# Patient Record
Sex: Female | Born: 1990 | Race: Black or African American | Hispanic: No | Marital: Single | Smoking: Current every day smoker
Health system: Southern US, Community
[De-identification: ages and names within clinical notes are randomized; demographics above are authoritative.]

## PROBLEM LIST (undated history)

## (undated) ENCOUNTER — Emergency Department (HOSPITAL_COMMUNITY): Admission: EM | Payer: Federal, State, Local not specified - PPO | Source: Home / Self Care

## (undated) DIAGNOSIS — G43909 Migraine, unspecified, not intractable, without status migrainosus: Secondary | ICD-10-CM

## (undated) DIAGNOSIS — Z789 Other specified health status: Secondary | ICD-10-CM

## (undated) HISTORY — PX: NO PAST SURGERIES: SHX2092

---
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
## 2008-08-13 ENCOUNTER — Emergency Department (HOSPITAL_COMMUNITY): Admission: EM | Admit: 2008-08-13 | Discharge: 2008-08-13 | Payer: Self-pay | Admitting: Family Medicine

## 2009-03-09 ENCOUNTER — Inpatient Hospital Stay (HOSPITAL_COMMUNITY): Admission: AD | Admit: 2009-03-09 | Discharge: 2009-03-10 | Payer: Self-pay | Admitting: Obstetrics and Gynecology

## 2009-03-23 ENCOUNTER — Inpatient Hospital Stay (HOSPITAL_COMMUNITY): Admission: AD | Admit: 2009-03-23 | Discharge: 2009-03-23 | Payer: Self-pay | Admitting: Obstetrics & Gynecology

## 2009-03-23 ENCOUNTER — Ambulatory Visit: Payer: Self-pay | Admitting: Obstetrics and Gynecology

## 2009-03-26 ENCOUNTER — Inpatient Hospital Stay (HOSPITAL_COMMUNITY): Admission: AD | Admit: 2009-03-26 | Discharge: 2009-03-26 | Payer: Self-pay | Admitting: Obstetrics & Gynecology

## 2009-03-26 ENCOUNTER — Ambulatory Visit: Payer: Self-pay | Admitting: Obstetrics & Gynecology

## 2009-03-26 ENCOUNTER — Inpatient Hospital Stay (HOSPITAL_COMMUNITY): Admission: AD | Admit: 2009-03-26 | Discharge: 2009-03-29 | Payer: Self-pay | Admitting: Obstetrics & Gynecology

## 2010-12-05 LAB — CBC
HCT: 37.8 % (ref 36.0–46.0)
Hemoglobin: 12.8 g/dL (ref 12.0–15.0)
MCHC: 33.9 g/dL (ref 30.0–36.0)
MCV: 89.2 fL (ref 78.0–100.0)
Platelets: 247 10*3/uL (ref 150–400)
RBC: 4.24 MIL/uL (ref 3.87–5.11)
RDW: 13.6 % (ref 11.5–15.5)
WBC: 13.5 10*3/uL — ABNORMAL HIGH (ref 4.0–10.5)

## 2010-12-05 LAB — GC/CHLAMYDIA PROBE AMP, URINE
Chlamydia, Swab/Urine, PCR: NEGATIVE
GC Probe Amp, Urine: NEGATIVE

## 2010-12-05 LAB — RAPID URINE DRUG SCREEN, HOSP PERFORMED
Amphetamines: NOT DETECTED
Barbiturates: NOT DETECTED
Benzodiazepines: NOT DETECTED
Cocaine: NOT DETECTED
Opiates: NOT DETECTED
Tetrahydrocannabinol: NOT DETECTED

## 2010-12-05 LAB — RPR: RPR Ser Ql: NONREACTIVE

## 2011-06-03 LAB — GC/CHLAMYDIA PROBE AMP, GENITAL
Chlamydia, DNA Probe: POSITIVE — AB
GC Probe Amp, Genital: NEGATIVE

## 2011-06-03 LAB — POCT URINALYSIS DIP (DEVICE)
Glucose, UA: NEGATIVE mg/dL
Hgb urine dipstick: NEGATIVE
Ketones, ur: NEGATIVE mg/dL
Nitrite: NEGATIVE
Protein, ur: 30 mg/dL — AB
Specific Gravity, Urine: 1.02 (ref 1.005–1.030)
Urobilinogen, UA: 1 mg/dL (ref 0.0–1.0)
pH: 8.5 — ABNORMAL HIGH (ref 5.0–8.0)

## 2011-06-03 LAB — WET PREP, GENITAL: Trich, Wet Prep: NONE SEEN

## 2011-06-03 LAB — POCT PREGNANCY, URINE: Preg Test, Ur: POSITIVE

## 2012-02-10 ENCOUNTER — Encounter (HOSPITAL_COMMUNITY): Payer: Self-pay | Admitting: Physical Medicine and Rehabilitation

## 2012-02-10 ENCOUNTER — Emergency Department (HOSPITAL_COMMUNITY)
Admission: EM | Admit: 2012-02-10 | Discharge: 2012-02-10 | Disposition: A | Discharge status: Home / Self Care | Payer: Federal, State, Local not specified - PPO | Attending: Emergency Medicine | Admitting: Emergency Medicine

## 2012-02-10 DIAGNOSIS — L509 Urticaria, unspecified: Secondary | ICD-10-CM | POA: Insufficient documentation

## 2012-02-10 MED ORDER — PREDNISONE 10 MG PO TABS: 20.0000 mg | ORAL_TABLET | Freq: Every day | ORAL | Status: DC

## 2012-02-10 MED ORDER — DIPHENHYDRAMINE HCL 25 MG PO CAPS
50.0000 mg | ORAL_CAPSULE | Freq: Once | ORAL | Status: AC
  Administered 2012-02-10: 50 mg via ORAL
  Filled 2012-02-10: qty 2

## 2012-02-10 MED ORDER — PREDNISONE 20 MG PO TABS
60.0000 mg | ORAL_TABLET | Freq: Once | ORAL | Status: AC
  Administered 2012-02-10: 60 mg via ORAL
  Filled 2012-02-10: qty 3

## 2012-02-10 NOTE — Discharge Instructions (Signed)
Hives Hives (urticaria) are itchy, red, swollen patches on the skin. They may change size, shape, and location quickly and repeatedly. Hives that occur deeper in the skin can cause swelling of the hands, feet, and face. Hives may be an allergic reaction to something you or your child ate, touched, or put on the skin. Hives can also be a reaction to cold, heat, viral infections, medication, insect bites, or emotional stress. Often the cause is hard to find. Hives can come and go for several days to several weeks. Hives are not contagious. HOME CARE INSTRUCTIONS   If the cause of the hives is known, avoid exposure to that source.   To relieve itching and rash:   Apply cold compresses to the skin or take cool water baths. Do not take or give your child hot baths or showers because the warmth will make the itching worse.   The best medicine for hives is an antihistamine. An antihistamine will not cure hives, but it will reduce their severity. You can use an antihistamine available over the counter. This medicine may make your child sleepy. Teenagers should not drive while using this medicine.   Take or give an antihistamine every 6 hours until the hives are completely gone for 24 hours or as directed.   Your child may have other medications prescribed for itching. Give these as directed by your child's caregiver.   You or your child should wear loose fitting clothing, including undergarments. Skin irritations may make hives worse.   Follow-up as directed by your caregiver.  SEEK MEDICAL CARE IF:   You or your child still have considerable itching after taking the medication (prescribed or purchased over the counter).   Joint swelling or pain occurs.  SEEK IMMEDIATE MEDICAL CARE IF:   You have a fever.   Swollen lips or tongue are noticed.   There is difficulty with breathing, swallowing, or tightness in the throat or chest.   Abdominal pain develops.   Your child starts acting very  sick.  These may be the first signs of a life-threatening allergic reaction. THIS IS AN EMERGENCY. Call 911 for medical help. MAKE SURE YOU:   Understand these instructions.   Will watch your condition.   Will get help right away if you are not doing well or get worse.  Document Released: 08/15/2005 Document Revised: 08/04/2011 Document Reviewed: 04/04/2008 [facility] Patient Information 2012 [place], [place].  Please continue benadryl every six hours.  Return if worsening especially throat tightness or difficulty breathing.

## 2012-02-10 NOTE — ED Notes (Signed)
Pt presents to department for evaluation of diffuse rash all over body. Started this morning while at home. Pt states rash has progressed throughout day. No relief from benadryl cream. Pt states itching and discomfort. Respirations unlabored. Lung sounds clear and equal bilaterally. She is alert and oriented x4.

## 2012-02-10 NOTE — ED Notes (Signed)
Pt presents to department for evaluation of allergic reaction. Hives noted to bilateral arms, stomach, legs and back. Onset this morning. No relief with cream at home, pt states itching and discomfort. Denies SOB. Respirations unlabored. She is alert and oriented x4.

## 2012-02-10 NOTE — ED Provider Notes (Signed)
History  This chart was scribed for Heather Quarry, MD by Bennett Scrape. This patient was seen in room STRE1/STRE1 and the patient's care was started at 4:20PM.   CSN: [id number]  Arrival date & time 02/10/12  1605   First MD Initiated Contact with Patient 02/10/12 1620      Chief Complaint  Patient presents with  . Allergic Reaction    The history is provided by the patient. No language interpreter was used.    LADAWNA WALGREN is a 21 y.o. female who presents to the Emergency Department complaining of a rash consistent with hives on her arms, legs, back and abdomen that started this morning. Pt works at a group home and states that she had no symptoms when she got off of work last night. Pt denies sick contacts with similar symptoms. She reports taking benadryl and trying hydrocortisone cream with no improvement. She denies changes in lotion, soaps or detergent. She denies having any known food allergies. She denies having trouble breathing, fever or SOB as associated symptoms. She has no h/o chronic medical conditions. She denies smoking and alcohol use.   No past medical history on file.  No past surgical history on file.  No family history on file.  History  Substance Use Topics  . Smoking status: Never Smoker   . Smokeless tobacco: Not on file  . Alcohol Use: No     Review of Systems  Constitutional: Negative for fever and chills.  HENT: Negative for trouble swallowing.   Respiratory: Negative for shortness of breath.   Skin: Positive for rash.    Allergies  Penicillins  Home Medications   Current Outpatient Rx  Name Route Sig Dispense Refill  . DIPHENHYDRAMINE HCL 12.5 MG/5ML PO LIQD Oral Take 25 mg by mouth daily as needed. For itching    . DIPHENHYDRAMINE-ZINC ACETATE 1-0.1 % EX CREA Topical Apply 1 application topically daily as needed. For itching      Triage Vitals: BP 107/75  Pulse 134  Temp 98.5 F (36.9 C) (Oral)  Resp 22  SpO2 100%  Physical  Exam  Nursing note and vitals reviewed. Constitutional: She is oriented to person, place, and time. She appears well-developed and well-nourished. No distress.  HENT:  Head: Normocephalic and atraumatic.  Mouth/Throat: Oropharynx is clear and moist. No oropharyngeal exudate.  Eyes: EOM are normal.  Neck: Neck supple. No tracheal deviation present.  Cardiovascular: Normal rate.   Pulmonary/Chest: Effort normal. No respiratory distress.  Musculoskeletal: Normal range of motion.  Neurological: She is alert and oriented to person, place, and time.  Skin: Skin is warm and dry. Rash noted.       Hives on all four extremities, stomach and back  Psychiatric: She has a normal mood and affect. Her behavior is normal.    ED Course  Procedures (including critical care time)  DIAGNOSTIC STUDIES: Oxygen Saturation is 100% on room air, normal by my interpretation.    COORDINATION OF CARE: 4:24PM-Discussed treatment plan with pt and pt agreed to plan. 6:26PM-Upon re-evaluation, there is no change. Rash is still present. Pt still denies trouble breathing.   Labs Reviewed - No data to display No results found.   No diagnosis found.    MDM  I personally performed the services described in this documentation, which was scribed in my presence. The recorded information has been reviewed and considered.    Heather Quarry, MD 02/22/12 [phone]

## 2012-04-10 ENCOUNTER — Emergency Department (HOSPITAL_COMMUNITY)
Admission: EM | Admit: 2012-04-10 | Discharge: 2012-04-10 | Disposition: A | Discharge status: Home / Self Care | Payer: Federal, State, Local not specified - PPO | Attending: Emergency Medicine | Admitting: Emergency Medicine

## 2012-04-10 ENCOUNTER — Encounter (HOSPITAL_COMMUNITY): Payer: Self-pay

## 2012-04-10 DIAGNOSIS — R42 Dizziness and giddiness: Secondary | ICD-10-CM | POA: Insufficient documentation

## 2012-04-10 LAB — URINALYSIS, ROUTINE W REFLEX MICROSCOPIC
Bilirubin Urine: NEGATIVE
Glucose, UA: NEGATIVE mg/dL
Hgb urine dipstick: NEGATIVE
Ketones, ur: NEGATIVE mg/dL
Leukocytes, UA: NEGATIVE
Nitrite: NEGATIVE
Protein, ur: NEGATIVE mg/dL
Specific Gravity, Urine: 1.009 (ref 1.005–1.030)
Urobilinogen, UA: 0.2 mg/dL (ref 0.0–1.0)
pH: 8 (ref 5.0–8.0)

## 2012-04-10 LAB — POCT I-STAT, CHEM 8
BUN: <3 mg/dL — ABNORMAL LOW (ref 6–23)
Calcium, Ion: 1.22 mmol/L (ref 1.12–1.23)
Chloride: 103 mEq/L (ref 96–112)
Creatinine, Ser: 0.8 mg/dL (ref 0.50–1.10)
Glucose, Bld: 109 mg/dL — ABNORMAL HIGH (ref 70–99)
HCT: 45 % (ref 36.0–46.0)
Hemoglobin: 15.3 g/dL — ABNORMAL HIGH (ref 12.0–15.0)
Potassium: 4.1 mEq/L (ref 3.5–5.1)
Sodium: 140 mEq/L (ref 135–145)
TCO2: 25 mmol/L (ref 0–100)

## 2012-04-10 LAB — PREGNANCY, URINE: Preg Test, Ur: NEGATIVE

## 2012-04-10 MED ORDER — MECLIZINE HCL 25 MG PO TABS
50.0000 mg | ORAL_TABLET | Freq: Once | ORAL | Status: AC
  Administered 2012-04-10: 50 mg via ORAL
  Filled 2012-04-10 (×2): qty 1

## 2012-04-10 MED ORDER — DIAZEPAM 5 MG/ML IJ SOLN
INTRAMUSCULAR | Status: AC
  Filled 2012-04-10: qty 2

## 2012-04-10 MED ORDER — SODIUM CHLORIDE 0.9 % IV BOLUS (SEPSIS)
1000.0000 mL | Freq: Once | INTRAVENOUS | Status: AC
  Administered 2012-04-10: 1000 mL via INTRAVENOUS

## 2012-04-10 MED ORDER — DIAZEPAM 5 MG/ML IJ SOLN
2.5000 mg | Freq: Once | INTRAMUSCULAR | Status: AC
  Administered 2012-04-10: 2.5 mg via INTRAVENOUS

## 2012-04-10 MED ORDER — MECLIZINE HCL 50 MG PO TABS: 50.0000 mg | ORAL_TABLET | Freq: Three times a day (TID) | ORAL | Status: AC | PRN

## 2012-04-10 NOTE — ED Notes (Signed)
Pt reports felt dizzy upon sitting then eased off after a couple of minutes. Denies dizziness when ambulated to bathroom. ED PA informed & aware

## 2012-04-10 NOTE — ED Notes (Signed)
Family at bedside. 

## 2012-04-10 NOTE — ED Notes (Signed)
Patient presented to the ER with complaint of dizziness onset yesterday when she woke up but got worse today. Patient denies any shortness of breath, no chest discomfort, denies any fever. Pt is A/A/Ox4, skin is warm and dry, respiration is even and unlabored.

## 2012-04-10 NOTE — ED Provider Notes (Signed)
History     CSN: [id number]  Arrival date & time 04/10/12  4098   First MD Initiated Contact with Patient 04/10/12 [phone]      Chief Complaint  Patient presents with  . Dizziness    (Consider location/radiation/quality/duration/timing/severity/associated sxs/prior treatment) HPI Comments: Heather Casey is a 21 y.o. Female who presents with complaint of dizziness. Stats it began yesterday when she woke up, progressively got worse through the day, and today she couldn't get up to go to work. States feels like room is spinning. States it is worsened with movement of the head. Unable to walk without holding on to things. Denies fever, chills, headache, nausea, vomiting, neck pain or stiffness. No history of the same. States multiple people at work with same symptoms, found mold there. 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 States drinks about 1 bottle of water a day, and eats one meal. No medical problems.    History reviewed. No pertinent past medical history.  History reviewed. No pertinent past surgical history.  No family history on file.  History  Substance Use Topics  . Smoking status: Never Smoker   . Smokeless tobacco: Not on file  . Alcohol Use: No    OB History    Grav Para Term Preterm Abortions TAB SAB Ect Mult Living                  Review of Systems  Constitutional: Negative for fever and chills.  HENT: Negative for neck pain and neck stiffness.   Eyes: Negative for photophobia, pain and visual disturbance.  Respiratory: Negative.   Cardiovascular: Negative.   Gastrointestinal: Negative.   Genitourinary: Negative for dysuria, hematuria and flank pain.  Musculoskeletal: Positive for gait problem. Negative for myalgias and joint swelling.  Skin: Negative.   Neurological: Positive for dizziness. Negative for speech difficulty, weakness, light-headedness, numbness and headaches.  Psychiatric/Behavioral: Negative.      Allergies  Penicillins  Home Medications   Current Outpatient Rx  Name Route Sig Dispense Refill  . NORETHIN-ETH ESTRAD-FE BIPHAS 1 MG-10 MCG / 10 MCG PO TABS Oral Take 1 tablet by mouth daily.      BP 124/77  Pulse 101  Temp 98.4 F (36.9 C) (Oral)  Resp 18  Ht 5\' 4"  (1.626 m)  Wt 178 lb (80.74 kg)  BMI 30.55 kg/m2  SpO2 100%  LMP 03/31/2012  Physical Exam  Nursing note and vitals reviewed. Constitutional: She is oriented to person, place, and time. She appears well-developed and well-nourished. No distress.  Eyes: Conjunctivae and EOM are normal. Pupils are equal, round, and reactive to light.       Horizontal nystagmus present  Neck: Neck supple.  Cardiovascular: Normal rate, regular rhythm and normal heart sounds.   Pulmonary/Chest: Effort normal and breath sounds normal. No respiratory distress. She has no wheezes. She has no rales.  Abdominal: Soft. Bowel sounds are normal. She exhibits no distension. There is no tenderness. There is no rebound.  Musculoskeletal: Normal range of motion.  Neurological: She is alert and oriented to person, place, and time.       No pronator drift. 5/5 and equal upper and lower extremity strength bilaterally  Skin: Skin is warm and dry.  Psychiatric: She has a normal mood and affect.    ED Course  Procedures (including critical care time)  Pt with clinical suspicion for vertigo. She is at no risk for possible CVA. She  is in no distress. Will get labs, fluids, try meclizine.   Results for orders placed during the hospital encounter of 04/10/12  URINALYSIS, ROUTINE W REFLEX MICROSCOPIC      Component Value Range   Color, Urine YELLOW  YELLOW   APPearance CLEAR  CLEAR   Specific Gravity, Urine 1.009  1.005 - 1.030   pH 8.0  5.0 - 8.0   Glucose, UA NEGATIVE  NEGATIVE mg/dL   Hgb urine dipstick NEGATIVE  NEGATIVE   Bilirubin Urine NEGATIVE  NEGATIVE   Ketones, ur NEGATIVE  NEGATIVE mg/dL   Protein, ur NEGATIVE  NEGATIVE mg/dL    Urobilinogen, UA 0.2  0.0 - 1.0 mg/dL   Nitrite NEGATIVE  NEGATIVE   Leukocytes, UA NEGATIVE  NEGATIVE  PREGNANCY, URINE      Component Value Range   Preg Test, Ur NEGATIVE  NEGATIVE  POCT I-STAT, CHEM 8      Component Value Range   Sodium 140  135 - 145 mEq/L   Potassium 4.1  3.5 - 5.1 mEq/L   Chloride 103  96 - 112 mEq/L   BUN <3 (*) 6 - 23 mg/dL   Creatinine, Ser 6.21  0.50 - 1.10 mg/dL   Glucose, Bld 308 (*) 70 - 99 mg/dL   Calcium, Ion 6.57  8.46 - 1.23 mmol/L   TCO2 25  0 - 100 mmol/L   Hemoglobin 15.3 (*) 12.0 - 15.0 g/dL   HCT 96.2  95.2 - 84.1 %   Unremarkable labs. Pt hydrated, some improvement with meclizine. Back to normal with valium and fluids. Will d/c home with follow up . Pt stats she is feeling much better, ambulated in the hallway to the bathroom. No distress.    1. Vertigo       MDM          Lottie Mussel, PA 04/10/12 1651

## 2012-04-10 NOTE — ED Notes (Signed)
Report received, assumed care.  

## 2012-04-10 NOTE — ED Provider Notes (Signed)
11:37 AM Assumed care of patient in the CDU.  Patient presenting with Vertigo.  Patient has been given IVF, Meclizine, and Valium with improvement.  Plan is for patient to discharged home with prescription for Meclizine and ENT follow up once the second fluid bolus is complete.  11:56 AM Reassessed patient.  Patient has completed her second IVF bolus.  She reports that her vertigo has resolved.  Patient alert and orientated x 3, Heart RRR, Lungs CTAB, EOM normal, no nystagmus, PEARLA, Normal gait.  Will discharge patient home.  Patient in agreement with the plan.  Pascal Lux [place], PA-C 04/10/12 1311

## 2012-04-11 NOTE — ED Provider Notes (Signed)
Medical screening examination/treatment/procedure(s) were performed by non-physician practitioner and as supervising physician I was immediately available for consultation/collaboration.  Annaka Cleaver R. Jennetta Flood, MD 04/11/12 1554 

## 2012-04-11 NOTE — ED Provider Notes (Signed)
Medical screening examination/treatment/procedure(s) were performed by non-physician practitioner and as supervising physician I was immediately available for consultation/collaboration.  Kaleeyah Cuffie R. Kenlee Vogt, MD 04/11/12 1554 

## 2014-11-04 ENCOUNTER — Encounter (HOSPITAL_COMMUNITY): Payer: Self-pay | Admitting: Emergency Medicine

## 2014-11-04 ENCOUNTER — Emergency Department (INDEPENDENT_AMBULATORY_CARE_PROVIDER_SITE_OTHER)
Admission: EM | Admit: 2014-11-04 | Discharge: 2014-11-04 | Disposition: A | Discharge status: Home / Self Care | Payer: Self-pay | Source: Home / Self Care | Attending: Family Medicine | Admitting: Family Medicine

## 2014-11-04 DIAGNOSIS — B9789 Other viral agents as the cause of diseases classified elsewhere: Principal | ICD-10-CM

## 2014-11-04 DIAGNOSIS — J069 Acute upper respiratory infection, unspecified: Secondary | ICD-10-CM

## 2014-11-04 MED ORDER — IPRATROPIUM BROMIDE 0.06 % NA SOLN: 2.0000 | Freq: Four times a day (QID) | NASAL | Status: DC

## 2014-11-04 MED ORDER — TRAMADOL HCL 50 MG PO TABS: 50.0000 mg | ORAL_TABLET | Freq: Four times a day (QID) | ORAL | Status: DC | PRN

## 2014-11-04 NOTE — ED Notes (Signed)
Cough, congestion, head hurts with cough, chest soreness, unable to sleep last night due to coughing.  Reports fever 101.2 last night.  Reports yellow phlegm.  No nausea, no vomiting, no diarrhea

## 2014-11-04 NOTE — Discharge Instructions (Signed)
Thank you for coming in today. Take up to 2 Aleve twice daily for pain and fever, or 3 regular strength ibuprofen every 8 hours for pain or fever. Use tramadol mostly at bedtime for cough. Do not drive after taking this medication. Call or go to the emergency room if you get worse, have trouble breathing, have chest pains, or palpitations.    Upper Respiratory Infection, Adult An upper respiratory infection (URI) is also sometimes known as the common cold. The upper respiratory tract includes the nose, sinuses, throat, trachea, and bronchi. Bronchi are the airways leading to the lungs. Most people improve within 1 week, but symptoms can last up to 2 weeks. A residual cough may last even longer.  CAUSES Many different viruses can infect the tissues lining the upper respiratory tract. The tissues become irritated and inflamed and often become very moist. Mucus production is also common. A cold is contagious. You can easily spread the virus to others by oral contact. This includes kissing, sharing a glass, coughing, or sneezing. Touching your mouth or nose and then touching a surface, which is then touched by another person, can also spread the virus. SYMPTOMS  Symptoms typically develop 1 to 3 days after you come in contact with a cold virus. Symptoms vary from person to person. They may include:  Runny nose.  Sneezing.  Nasal congestion.  Sinus irritation.  Sore throat.  Loss of voice (laryngitis).  Cough.  Fatigue.  Muscle aches.  Loss of appetite.  Headache.  Low-grade fever. DIAGNOSIS  You might diagnose your own cold based on familiar symptoms, since most people get a cold 2 to 3 times a year. Your caregiver can confirm this based on your exam. Most importantly, your caregiver can check that your symptoms are not due to another disease such as strep throat, sinusitis, pneumonia, asthma, or epiglottitis. Blood tests, throat tests, and X-rays are not necessary to diagnose a  common cold, but they may sometimes be helpful in excluding other more serious diseases. Your caregiver will decide if any further tests are required. RISKS AND COMPLICATIONS  You may be at risk for a more severe case of the common cold if you smoke cigarettes, have chronic heart disease (such as heart failure) or lung disease (such as asthma), or if you have a weakened immune system. The very young and very old are also at risk for more serious infections. Bacterial sinusitis, middle ear infections, and bacterial pneumonia can complicate the common cold. The common cold can worsen asthma and chronic obstructive pulmonary disease (COPD). Sometimes, these complications can require emergency medical care and may be life-threatening. PREVENTION  The best way to protect against getting a cold is to practice good hygiene. Avoid oral or hand contact with people with cold symptoms. Wash your hands often if contact occurs. There is no clear evidence that vitamin C, vitamin E, echinacea, or exercise reduces the chance of developing a cold. However, it is always recommended to get plenty of rest and practice good nutrition. TREATMENT  Treatment is directed at relieving symptoms. There is no cure. Antibiotics are not effective, because the infection is caused by a virus, not by bacteria. Treatment may include:  Increased fluid intake. Sports drinks offer valuable electrolytes, sugars, and fluids.  Breathing heated mist or steam (vaporizer or shower).  Eating chicken soup or other clear broths, and maintaining good nutrition.  Getting plenty of rest.  Using gargles or lozenges for comfort.  Controlling fevers with ibuprofen or acetaminophen as  directed by your caregiver.  Increasing usage of your inhaler if you have asthma. Zinc gel and zinc lozenges, taken in the first 24 hours of the common cold, can shorten the duration and lessen the severity of symptoms. Pain medicines may help with fever, muscle  aches, and throat pain. A variety of non-prescription medicines are available to treat congestion and runny nose. Your caregiver can make recommendations and may suggest nasal or lung inhalers for other symptoms.  HOME CARE INSTRUCTIONS   Only take over-the-counter or prescription medicines for pain, discomfort, or fever as directed by your caregiver.  Use a warm mist humidifier or inhale steam from a shower to increase air moisture. This may keep secretions moist and make it easier to breathe.  Drink enough water and fluids to keep your urine clear or pale yellow.  Rest as needed.  Return to work when your temperature has returned to normal or as your caregiver advises. You may need to stay home longer to avoid infecting others. You can also use a face mask and careful hand washing to prevent spread of the virus. SEEK MEDICAL CARE IF:   After the first few days, you feel you are getting worse rather than better.  You need your caregiver's advice about medicines to control symptoms.  You develop chills, worsening shortness of breath, or brown or red sputum. These may be signs of pneumonia.  You develop yellow or brown nasal discharge or pain in the face, especially when you bend forward. These may be signs of sinusitis.  You develop a fever, swollen neck glands, pain with swallowing, or white areas in the back of your throat. These may be signs of strep throat. SEEK IMMEDIATE MEDICAL CARE IF:   You have a fever.  You develop severe or persistent headache, ear pain, sinus pain, or chest pain.  You develop wheezing, a prolonged cough, cough up blood, or have a change in your usual mucus (if you have chronic lung disease).  You develop sore muscles or a stiff neck. Document Released: 02/08/2001 Document Revised: 11/07/2011 Document Reviewed: 11/20/2013 [facility] Patient Information 2015 [place], [place]. This information is not intended to replace advice given to you by your health care  provider. Make sure you discuss any questions you have with your health care provider.

## 2014-11-04 NOTE — ED Provider Notes (Signed)
Heather Casey is a 24 y.o. female who presents to Urgent Care today for cough congestion runny nose. Symptoms present for 3 days. Patient notes mild central sharp chest pain worse with deep inspiration. No radiating pain or exertional component. No trouble breathing palpitations abdominal pain and vomiting or diarrhea. She's tried Mucinex and NyQuil which helped. The cough is bothersome at work. Patient missed work today.   History reviewed. No pertinent past medical history. History reviewed. No pertinent past surgical history. History  Substance Use Topics  . Smoking status: Current Every Day Smoker  . Smokeless tobacco: Not on file  . Alcohol Use: Yes   ROS as above Medications: No current facility-administered medications for this encounter.   Current Outpatient Prescriptions  Medication Sig Dispense Refill  . Etonogestrel (NEXPLANON [place]) Inject into the skin.    Marland Kitchen. guaiFENesin (MUCINEX) 600 MG 12 hr tablet Take by mouth 2 (two) times daily.    . Pseudoeph-Doxylamine-DM-APAP (NYQUIL PO) Take by mouth.    Marland Kitchen. ipratropium (ATROVENT) 0.06 % nasal spray Place 2 sprays into both nostrils 4 (four) times daily. 15 mL 1  . Norethindrone-Ethinyl Estradiol-Fe Biphas (LO LOESTRIN FE) 1 MG-10 MCG / 10 MCG tablet Take 1 tablet by mouth daily.    . traMADol (ULTRAM) 50 MG tablet Take 1 tablet (50 mg total) by mouth every 6 (six) hours as needed. 15 tablet 0   Allergies  Allergen Reactions  . Penicillins Rash     Exam:  BP 130/65 mmHg  Pulse 98  Temp(Src) 99.4 F (37.4 C) (Oral)  Resp 16  SpO2 99% Gen: Well NAD HEENT: EOMI,  MMM clear nasal discharge. Normal panic membranes bilaterally. Posterior pharynx with cobblestoning. Lungs: Normal work of breathing. CTABL Heart: RRR no MRG Abd: NABS, Soft. Nondistended, Nontender Exts: Brisk capillary refill, warm and well perfused.   No results found for this or any previous visit (from the past 24 hour(s)). No results found.  Assessment and  Plan: 24 y.o. female with viral URI. Treat with Atrovent nasal spray, NSAIDs, and tramadol for cough.  Discussed warning signs or symptoms. Please see discharge instructions. Patient expresses understanding.     Rodolph BongEvan S Shahla Betsill, MD 11/04/14 1024

## 2015-11-19 ENCOUNTER — Emergency Department (HOSPITAL_COMMUNITY)
Admission: EM | Admit: 2015-11-19 | Discharge: 2015-11-20 | Disposition: A | Discharge status: Home / Self Care | Payer: Medicaid Other | Source: Home / Self Care | Attending: Emergency Medicine | Admitting: Emergency Medicine

## 2015-11-19 ENCOUNTER — Emergency Department (HOSPITAL_COMMUNITY)
Admission: EM | Admit: 2015-11-19 | Discharge: 2015-11-19 | Disposition: A | Discharge status: Home / Self Care | Payer: Medicaid Other | Attending: Emergency Medicine | Admitting: Emergency Medicine

## 2015-11-19 ENCOUNTER — Encounter (HOSPITAL_COMMUNITY): Payer: Self-pay | Admitting: Emergency Medicine

## 2015-11-19 ENCOUNTER — Encounter (HOSPITAL_COMMUNITY): Payer: Self-pay | Admitting: Cardiology

## 2015-11-19 DIAGNOSIS — L03119 Cellulitis of unspecified part of limb: Secondary | ICD-10-CM

## 2015-11-19 DIAGNOSIS — L03113 Cellulitis of right upper limb: Secondary | ICD-10-CM | POA: Insufficient documentation

## 2015-11-19 DIAGNOSIS — Z23 Encounter for immunization: Secondary | ICD-10-CM

## 2015-11-19 DIAGNOSIS — Z880 Allergy status to penicillin: Secondary | ICD-10-CM | POA: Insufficient documentation

## 2015-11-19 DIAGNOSIS — L03011 Cellulitis of right finger: Secondary | ICD-10-CM | POA: Insufficient documentation

## 2015-11-19 DIAGNOSIS — F17200 Nicotine dependence, unspecified, uncomplicated: Secondary | ICD-10-CM

## 2015-11-19 DIAGNOSIS — Z79899 Other long term (current) drug therapy: Secondary | ICD-10-CM | POA: Insufficient documentation

## 2015-11-19 DIAGNOSIS — M79641 Pain in right hand: Secondary | ICD-10-CM | POA: Diagnosis present

## 2015-11-19 LAB — CBC
HEMATOCRIT: 42.5 % (ref 36.0–46.0)
Hemoglobin: 14.4 g/dL (ref 12.0–15.0)
MCH: 29.7 pg (ref 26.0–34.0)
MCHC: 33.9 g/dL (ref 30.0–36.0)
MCV: 87.6 fL (ref 78.0–100.0)
Platelets: 451 10*3/uL — ABNORMAL HIGH (ref 150–400)
RBC: 4.85 MIL/uL (ref 3.87–5.11)
RDW: 12.7 % (ref 11.5–15.5)
WBC: 6.7 10*3/uL (ref 4.0–10.5)

## 2015-11-19 LAB — BASIC METABOLIC PANEL
Anion gap: 12 (ref 5–15)
BUN: 6 mg/dL (ref 6–20)
CALCIUM: 9.2 mg/dL (ref 8.9–10.3)
CO2: 21 mmol/L — AB (ref 22–32)
CREATININE: 0.74 mg/dL (ref 0.44–1.00)
Chloride: 106 mmol/L (ref 101–111)
GFR calc Af Amer: >60 mL/min (ref >60)
GFR calc non Af Amer: >60 mL/min (ref >60)
Glucose, Bld: 170 mg/dL — ABNORMAL HIGH (ref 65–99)
Potassium: 3.8 mmol/L (ref 3.5–5.1)
SODIUM: 139 mmol/L (ref 135–145)

## 2015-11-19 MED ORDER — CLINDAMYCIN PHOSPHATE 600 MG/4ML IJ SOLN
600.0000 mg | Freq: Once | INTRAMUSCULAR | Status: AC
  Administered 2015-11-19: 600 mg via INTRAMUSCULAR
  Filled 2015-11-19: qty 4

## 2015-11-19 MED ORDER — CLINDAMYCIN HCL 300 MG PO CAPS: 300.0000 mg | ORAL_CAPSULE | Freq: Four times a day (QID) | ORAL | Status: DC

## 2015-11-19 NOTE — ED Notes (Signed)
[facility] Pt states the pain is worse and she itching is traveling up her arm. Pt states the swelling is getting worse.

## 2015-11-19 NOTE — ED Provider Notes (Signed)
CSN: [id number]     Arrival date & time 11/19/15  1348 History   By signing my name below, I, Marisue Humble, attest that this documentation has been prepared under the direction and in the presence of non-physician practitioner, Cheri Fowler, PA-C. Electronically Signed: Marisue Humble, Scribe. 11/19/2015. 3:43 PM.   Chief Complaint  Patient presents with  . Hand Pain   The history is provided by the patient. No language interpreter was used.   HPI Comments:  Heather Casey is a 25 y.o. female who presents to the Emergency Department complaining of erythema, swelling, and mild pain in right index finger onset this morning. Pt reports associated itching in finger yesterday and limited ROM of joint. No alleviating factors noted or treatments attempted PTA. Pt denies injury to finger, fever, chills, nausea or vomiting.  History reviewed. No pertinent past medical history. History reviewed. No pertinent past surgical history. History reviewed. No pertinent family history. Social History  Substance Use Topics  . Smoking status: Current Every Day Smoker  . Smokeless tobacco: None  . Alcohol Use: Yes   OB History    No data available     Review of Systems  Constitutional: Negative for fever and chills.  Gastrointestinal: Negative for nausea and vomiting.  Musculoskeletal: Positive for joint swelling and arthralgias.  Skin: Positive for color change. Negative for wound.  All other systems reviewed and are negative.  Allergies  Penicillins  Home Medications   Prior to Admission medications   Medication Sig Start Date End Date Taking? Authorizing Provider  Etonogestrel (NEXPLANON [place]) Inject into the skin.    Historical Provider, MD  guaiFENesin (MUCINEX) 600 MG 12 hr tablet Take by mouth 2 (two) times daily.    Historical Provider, MD  ipratropium (ATROVENT) 0.06 % nasal spray Place 2 sprays into both nostrils 4 (four) times daily. 11/04/14   Rodolph Bong, MD  Norethindrone-Ethinyl  Estradiol-Fe Biphas (LO LOESTRIN FE) 1 MG-10 MCG / 10 MCG tablet Take 1 tablet by mouth daily.    Historical Provider, MD  Pseudoeph-Doxylamine-DM-APAP (NYQUIL PO) Take by mouth.    Historical Provider, MD  traMADol (ULTRAM) 50 MG tablet Take 1 tablet (50 mg total) by mouth every 6 (six) hours as needed. 11/04/14   Rodolph Bong, MD   BP 98/74 mmHg  Pulse 90  Temp(Src) 98.1 F (36.7 C) (Oral)  Resp 18  Wt 80.74 kg  SpO2 99% Physical Exam  Constitutional: She is oriented to person, place, and time. She appears well-developed and well-nourished.  HENT:  Head: Normocephalic and atraumatic.  Right Ear: External ear normal.  Left Ear: External ear normal.  Eyes: Conjunctivae are normal. No scleral icterus.  Neck: No tracheal deviation present.  Cardiovascular:  Capillary refill less than 3 seconds.  Pulmonary/Chest: Effort normal. No respiratory distress.  Abdominal: She exhibits no distension.  Musculoskeletal: Normal range of motion.  FAROM of right wrist, and MCPJ.  Decreased ROM of right index PIPJ secondary to pain and swelling. No volar tenderness.  Neurological: She is alert and oriented to person, place, and time.  Skin: Skin is warm and dry.  Area of fluctuance over PIPJ of right index finger with surround erythema and warmth extending to dorsum of right hand. See photos below.   Psychiatric: She has a normal mood and affect. Her behavior is normal.      ED Course  Procedures  DIAGNOSTIC STUDIES:  Oxygen Saturation is 99% on RA, normal by my interpretation.  COORDINATION OF CARE:  3:42 PM Discussed treatment plan with pt at bedside and pt agreed to plan. 3:49 PM Consulted with attending physician.   Labs Review Labs Reviewed  CBC - Abnormal; Notable for the following:    Platelets 451 (*)    All other components within normal limits  BASIC METABOLIC PANEL - Abnormal; Notable for the following:    CO2 21 (*)    Glucose, Bld 170 (*)    All other components within  normal limits    Imaging Review No results found. I have personally reviewed and evaluated these images and lab results as part of my medical decision-making.   EKG Interpretation None      MDM   Final diagnoses:  Cellulitis of finger of right hand   Impression presents with right index finger pain that began this morning. Associated symptoms include redness and warmth. VSS, NAD. On exam, area of fluctuance over D IPJ on right index finger with overlying erythema and warmth extending to the dorsum of the right hand. Decreased range of motion at the DIPJ due to swelling and pain. No volar tenderness. Good capillary refill. Spoke with Dr. Debby Budoley's NP, recommend one dose 600 mg IM clindamycin. Into discharge home with 300 mg clindamycin every 6 hours for 5 days. Follow-up in the office Monday. Discussed return precautions. Patient agrees and acknowledges the above plan for discharge.  I personally performed the services described in this documentation, which was scribed in my presence. The recorded information has been reviewed and is accurate.    Cheri FowlerKayla Ellis Koffler, PA-C 11/19/15 1728  Geoffery Lyonsouglas Delo, MD 11/20/15 [phone]

## 2015-11-19 NOTE — Discharge Instructions (Signed)
1. Continue home medications. 2. Start taking Clindamycin three times a day for your finger infection.  You may take tylenol or ibuprofen for pain. 3.  Follow up with Dr. Debby Budoley's office on Monday for re-check.  Return to the ED or call his office if you experience worsening pain, swelling, or the redness appears to be spreading.   Cellulitis Cellulitis is an infection of the skin and the tissue beneath it. The infected area is usually red and tender. Cellulitis occurs most often in the arms and lower legs.  CAUSES  Cellulitis is caused by bacteria that enter the skin through cracks or cuts in the skin. The most common types of bacteria that cause cellulitis are staphylococci and streptococci. SIGNS AND SYMPTOMS   Redness and warmth.  Swelling.  Tenderness or pain.  Fever. DIAGNOSIS  Your health care provider can usually determine what is wrong based on a physical exam. Blood tests may also be done. TREATMENT  Treatment usually involves taking an antibiotic medicine. HOME CARE INSTRUCTIONS   Take your antibiotic medicine as directed by your health care provider. Finish the antibiotic even if you start to feel better.  Keep the infected arm or leg elevated to reduce swelling.  Apply a warm cloth to the affected area up to 4 times per day to relieve pain.  Take medicines only as directed by your health care provider.  Keep all follow-up visits as directed by your health care provider. SEEK MEDICAL CARE IF:   You notice red streaks coming from the infected area.  Your red area gets larger or turns dark in color.  Your bone or joint underneath the infected area becomes painful after the skin has healed.  Your infection returns in the same area or another area.  You notice a swollen bump in the infected area.  You develop new symptoms.  You have a fever. SEEK IMMEDIATE MEDICAL CARE IF:   You feel very sleepy.  You develop vomiting or diarrhea.  You have a general ill  feeling (malaise) with muscle aches and pains.   This information is not intended to replace advice given to you by your health care provider. Make sure you discuss any questions you have with your health care provider.   Document Released: 05/25/2005 Document Revised: 05/06/2015 Document Reviewed: 10/31/2011 Elsevier Interactive Patient Education [employer]2016 Elsevier Inc.

## 2015-11-19 NOTE — ED Notes (Signed)
Pt reports she noticed that her right index was itching and then it was swollen and red when she woke up this morning. 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 Redness noted up into the joint.

## 2015-11-20 ENCOUNTER — Encounter (HOSPITAL_COMMUNITY): Payer: Self-pay | Admitting: Emergency Medicine

## 2015-11-20 MED ORDER — TETANUS-DIPHTH-ACELL PERTUSSIS 5-2.5-18.5 LF-MCG/0.5 IM SUSP
0.5000 mL | Freq: Once | INTRAMUSCULAR | Status: AC
  Administered 2015-11-20: 0.5 mL via INTRAMUSCULAR
  Filled 2015-11-20: qty 0.5

## 2015-11-20 MED ORDER — CLINDAMYCIN HCL 300 MG PO CAPS
300.0000 mg | ORAL_CAPSULE | Freq: Once | ORAL | Status: AC
  Administered 2015-11-20: 300 mg via ORAL
  Filled 2015-11-20: qty 1

## 2015-11-20 MED ORDER — NAPROXEN 500 MG PO TABS: 500.0000 mg | ORAL_TABLET | Freq: Two times a day (BID) | ORAL | Status: DC

## 2015-11-20 MED ORDER — KETOROLAC TROMETHAMINE 60 MG/2ML IM SOLN
60.0000 mg | Freq: Once | INTRAMUSCULAR | Status: AC
  Administered 2015-11-20: 60 mg via INTRAMUSCULAR
  Filled 2015-11-20: qty 2

## 2015-11-20 NOTE — Discharge Instructions (Signed)

## 2015-11-20 NOTE — ED Provider Notes (Signed)
CSN: [id number]     Arrival date & time 11/19/15  2153 History   By signing my name below, I, Heather Casey, attest that this documentation has been prepared under the direction and in the presence of Kaliq Lege, MD.  Electronically Signed: Arlan OrganAshley Casey, ED Scribe. 11/20/2015. 12:31 AM.   Chief Complaint  Patient presents with  . Cellulitis    right hand pointer finger    Patient is a 25 y.o. female presenting with rash. The history is provided by the patient. No language interpreter was used.  Rash Location:  Hand Hand rash location:  R finger Quality: painful, redness and swelling   Pain details:    Quality:  Aching   Severity:  Moderate   Onset quality:  Gradual   Timing:  Constant   Progression:  Unchanged Severity:  Moderate Onset quality:  Gradual Timing:  Constant Progression:  Unchanged Chronicity:  New Context: not animal contact   Relieved by:  Nothing Worsened by:  Nothing tried Associated symptoms: joint pain   Associated symptoms: no abdominal pain, no fever, no headaches, no nausea, no shortness of breath and not vomiting     HPI Comments: Heather Casey is a 25 y.o. female without any pertinent past medical history who presents to the Emergency Department here for possible cellulitis this evening. Pt states she was evaluated earlier today in the Emergency Department and was diagnosed with cellulitis to the R hand. She was discharged with a prescription for Clindamycin with first dose given in department at 5:00 PM. However, pt admits she has not taken any doses after evaluation. Since time of visit, pain, swelling, and itching to hand has worsened and has radiated to the R arm. No aggravating or alleviating factors at this time. OTC medications attempted prior to arrival without any improvement. No recent fever, chills, nausea, vomiting, or diarrhea. No noted insect or animal bites. no recent changes in medications. Tetanus status unknown.  PCP: Default, Provider,  MD    History reviewed. No pertinent past medical history. History reviewed. No pertinent past surgical history. No family history on file. Social History  Substance Use Topics  . Smoking status: Current Every Day Smoker -- 0.50 packs/day  . Smokeless tobacco: None  . Alcohol Use: Yes     Comment: socially   OB History    No data available     Review of Systems  Constitutional: Negative for fever and chills.  Respiratory: Negative for shortness of breath.   Cardiovascular: Negative for chest pain.  Gastrointestinal: Negative for nausea, vomiting and abdominal pain.  Musculoskeletal: Positive for joint swelling and arthralgias.  Skin: Positive for rash.  Neurological: Negative for headaches.  Psychiatric/Behavioral: Negative for confusion.  All other systems reviewed and are negative.     Allergies  Penicillins  Home Medications   Prior to Admission medications   Medication Sig Start Date End Date Taking? Authorizing Provider  acetaminophen (TYLENOL) 500 MG tablet Take 1,000 mg by mouth every 6 (six) hours as needed for mild pain.   Yes Historical Provider, MD  Etonogestrel (NEXPLANON [place]) Inject into the skin.   Yes Historical Provider, MD  phentermine (ADIPEX-P) 37.5 MG tablet Take 37.5 mg by mouth daily before breakfast.   Yes Historical Provider, MD  clindamycin (CLEOCIN) 300 MG capsule Take 1 capsule (300 mg total) by mouth every 6 (six) hours. 11/19/15   Kayla Rose, PA-C  ipratropium (ATROVENT) 0.06 % nasal spray Place 2 sprays into both nostrils 4 (four) times  daily. Patient not taking: Reported on 11/19/2015 11/04/14   Rodolph Bong, MD  traMADol (ULTRAM) 50 MG tablet Take 1 tablet (50 mg total) by mouth every 6 (six) hours as needed. Patient not taking: Reported on 11/19/2015 11/04/14   Rodolph Bong, MD   Triage Vitals: BP 121/81 mmHg  Pulse 101  Temp(Src) 98.2 F (36.8 C) (Oral)  Resp 16  SpO2 100%   Physical Exam  Constitutional: She is oriented to person,  place, and time. She appears well-developed and well-nourished. No distress.  HENT:  Head: Normocephalic and atraumatic.  Mouth/Throat: Oropharynx is clear and moist.  Eyes: EOM are normal. Pupils are equal, round, and reactive to light.  Neck: Normal range of motion. Neck supple.  Cardiovascular: Normal rate, regular rhythm and normal heart sounds.   Pulmonary/Chest: Effort normal and breath sounds normal. She has no wheezes. She has no rales.  Abdominal: Soft. Bowel sounds are normal. She exhibits no distension. There is no tenderness. There is no rebound.  Musculoskeletal: Normal range of motion.       Right hand: She exhibits swelling. She exhibits normal range of motion and normal capillary refill. Normal sensation noted. Normal strength noted.       Hands: FROM of R index finger Capillary refill less than 2 seconds  No [place] signs Finger essentially unchanged from previous visit and picture Area was marked with a sterile marking pen   Neurological: She is alert and oriented to person, place, and time.  Skin: Skin is warm and dry.  Psychiatric: She has a normal mood and affect. Judgment normal.  Nursing note and vitals reviewed.   ED Course  Procedures (including critical care time)  DIAGNOSTIC STUDIES: Oxygen Saturation is 100% on RA, Normal by my interpretation.    COORDINATION OF CARE: 12:19 AM-Discussed treatment plan with pt at bedside and pt agreed to plan.     Labs Review Labs Reviewed - No data to display  Imaging Review No results found. I have personally reviewed and evaluated these images and lab results as part of my medical decision-making.   EKG Interpretation None      MDM   Final diagnoses:  None   Medications  ketorolac (TORADOL) injection 60 mg (60 mg Intramuscular Given 11/20/15 0042)  Tdap (BOOSTRIX) injection 0.5 mL (0.5 mLs Intramuscular Given 11/20/15 0042)  clindamycin (CLEOCIN) capsule 300 mg (300 mg Oral Given 11/20/15 0042)   Marked  with sterile marking pen, based on picture in the chart cellulitis is unchanged.    Continue clindamycin elevate the hand, call hand surgery in the am for follow up within 48 hours.  Return for fever > 100.4, vomiting, diarrhea or streaking up the arm  I personally performed the services described in this documentation, which was scribed in my presence. The recorded information has been reviewed and is accurate.     Cy Blamer, MD 11/20/15 [phone]

## 2015-11-20 NOTE — ED Notes (Signed)
MD at bedside. 

## 2016-10-15 ENCOUNTER — Encounter (HOSPITAL_COMMUNITY): Payer: Self-pay | Admitting: *Deleted

## 2016-10-15 ENCOUNTER — Inpatient Hospital Stay (HOSPITAL_COMMUNITY)
Admission: AD | Admit: 2016-10-15 | Discharge: 2016-10-15 | Disposition: A | Discharge status: Home / Self Care | Payer: Medicaid Other | Source: Ambulatory Visit | Attending: Obstetrics and Gynecology | Admitting: Obstetrics and Gynecology

## 2016-10-15 DIAGNOSIS — N390 Urinary tract infection, site not specified: Secondary | ICD-10-CM | POA: Insufficient documentation

## 2016-10-15 DIAGNOSIS — Z3202 Encounter for pregnancy test, result negative: Secondary | ICD-10-CM | POA: Diagnosis not present

## 2016-10-15 DIAGNOSIS — N898 Other specified noninflammatory disorders of vagina: Secondary | ICD-10-CM | POA: Diagnosis present

## 2016-10-15 DIAGNOSIS — F17210 Nicotine dependence, cigarettes, uncomplicated: Secondary | ICD-10-CM | POA: Diagnosis not present

## 2016-10-15 HISTORY — DX: Other specified health status: Z78.9

## 2016-10-15 LAB — WET PREP, GENITAL
Clue Cells Wet Prep HPF POC: NONE SEEN
Sperm: NONE SEEN
TRICH WET PREP: NONE SEEN
YEAST WET PREP: NONE SEEN

## 2016-10-15 LAB — URINALYSIS, ROUTINE W REFLEX MICROSCOPIC
Bilirubin Urine: NEGATIVE
Glucose, UA: NEGATIVE mg/dL
Ketones, ur: 5 mg/dL — AB
Nitrite: NEGATIVE
PH: 5 (ref 5.0–8.0)
Protein, ur: 100 mg/dL — AB
Specific Gravity, Urine: 1.033 — ABNORMAL HIGH (ref 1.005–1.030)

## 2016-10-15 LAB — POCT PREGNANCY, URINE: Preg Test, Ur: NEGATIVE

## 2016-10-15 MED ORDER — SULFAMETHOXAZOLE-TRIMETHOPRIM 800-160 MG PO TABS: 1.0000 | ORAL_TABLET | Freq: Two times a day (BID) | ORAL | 0 refills | Status: AC

## 2016-10-15 MED ORDER — SULFAMETHOXAZOLE-TRIMETHOPRIM 800-160 MG PO TABS: 1.0000 | ORAL_TABLET | Freq: Two times a day (BID) | ORAL | 0 refills | Status: DC

## 2016-10-15 NOTE — Discharge Instructions (Signed)
Urinary Tract Infection, Adult Introduction A urinary tract infection (UTI) is an infection of any part of the urinary tract. The urinary tract includes the:  Kidneys.  Ureters.  Bladder.  Urethra. These organs make, store, and get rid of pee (urine) in the body. Follow these instructions at home:  Take over-the-counter and prescription medicines only as told by your doctor.  If you were prescribed an antibiotic medicine, take it as told by your doctor. Do not stop taking the antibiotic even if you start to feel better.  Avoid the following drinks:  Alcohol.  Caffeine.  Tea.  Carbonated drinks.  Drink enough fluid to keep your pee clear or pale yellow.  Keep all follow-up visits as told by your doctor. This is important.  Make sure to:  Empty your bladder often and completely. Do not to hold pee for long periods of time.  Empty your bladder before and after sex.  Wipe from front to back after a bowel movement if you are female. Use each tissue one time when you wipe. Contact a doctor if:  You have back pain.  You have a fever.  You feel sick to your stomach (nauseous).  You throw up (vomit).  Your symptoms do not get better after 3 days.  Your symptoms go away and then come back. Get help right away if:  You have very bad back pain.  You have very bad lower belly (abdominal) pain.  You are throwing up and cannot keep down any medicines or water. This information is not intended to replace advice given to you by your health care provider. Make sure you discuss any questions you have with your health care provider. Document Released: 02/01/2008 Document Revised: 01/21/2016 Document Reviewed: 07/06/2015  2017 Elsevier  

## 2016-10-15 NOTE — MAU Provider Note (Signed)
History     CSN: [id number]  Arrival date and time: 10/15/16 1954   First Provider Initiated Contact with Patient 10/15/16 2031      Chief Complaint  Patient presents with  . Vaginal Discharge  . Back Pain   HPI   [name] is a 26 y.o. female [id number] here in MAU with vaginal discharge. She just finished her menstrual cycle on Thursday. The discharge is "sticky like". The discharge does not have an odor it is just different then what she has had in the past. New Sexual partner recently.   OB History    Gravida Para Term Preterm AB Living   3 1 1   2 1    SAB TAB Ectopic Multiple Live Births     2     1      Past Medical History:  Diagnosis Date  . Medical history non-contributory     Past Surgical History:  Procedure Laterality Date  . NO PAST SURGERIES      Family History  Problem Relation Age of Onset  . Hypertension Mother     Social History  Substance Use Topics  . Smoking status: Light Tobacco Smoker    Packs/day: 0.25  . Smokeless tobacco: Never Used  . Alcohol use Yes     Comment: socially    Allergies:  Allergies  Allergen Reactions  . Penicillins Rash and Other (See Comments)        Prescriptions Prior to Admission  Medication Sig Dispense Refill Last Dose  . acetaminophen (TYLENOL) 500 MG tablet Take 1,000 mg by mouth every 6 (six) hours as needed for mild pain.   Past Week at Unknown time  . clindamycin (CLEOCIN) 300 MG capsule Take 1 capsule (300 mg total) by mouth every 6 (six) hours. 20 capsule 0   . Etonogestrel (NEXPLANON [place]) Inject into the skin.   2015 at unknown  . ipratropium (ATROVENT) 0.06 % nasal spray Place 2 sprays into both nostrils 4 (four) times daily. (Patient not taking: Reported on 11/19/2015) 15 mL 1   . naproxen (NAPROSYN) 500 MG tablet Take 1 tablet (500 mg total) by mouth 2 (two) times daily. 30 tablet 0   . phentermine (ADIPEX-P) 37.5 MG tablet Take 37.5 mg by mouth daily before breakfast.   Past Week at Unknown  time  . traMADol (ULTRAM) 50 MG tablet Take 1 tablet (50 mg total) by mouth every 6 (six) hours as needed. (Patient not taking: Reported on 11/19/2015) 15 tablet 0    Results for orders placed or performed during the hospital encounter of 10/15/16 (from the past 48 hour(s))  Urinalysis, Routine w reflex microscopic     Status: Abnormal   Collection Time: 10/15/16  8:14 PM  Result Value Ref Range   Color, Urine AMBER (A) YELLOW    Comment: BIOCHEMICALS MAY BE AFFECTED BY COLOR   APPearance HAZY (A) CLEAR   Specific Gravity, Urine 1.033 (H) 1.005 - 1.030   pH 5.0 5.0 - 8.0   Glucose, UA NEGATIVE NEGATIVE mg/dL   Hgb urine dipstick LARGE (A) NEGATIVE   Bilirubin Urine NEGATIVE NEGATIVE   Ketones, ur 5 (A) NEGATIVE mg/dL   Protein, ur 147 (A) NEGATIVE mg/dL   Nitrite NEGATIVE NEGATIVE   Leukocytes, UA MODERATE (A) NEGATIVE   RBC / HPF TOO NUMEROUS TO COUNT 0 - 5 RBC/hpf   WBC, UA TOO NUMEROUS TO COUNT 0 - 5 WBC/hpf   Bacteria, UA FEW (A) NONE SEEN  Squamous Epithelial / LPF 0-5 (A) NONE SEEN   Mucous PRESENT    Non Squamous Epithelial 0-5 (A) NONE SEEN  Pregnancy, urine POC     Status: None   Collection Time: 10/15/16  8:34 PM  Result Value Ref Range   Preg Test, Ur NEGATIVE NEGATIVE    Comment:        THE SENSITIVITY OF THIS METHODOLOGY IS >24 mIU/mL   Wet prep, genital     Status: Abnormal   Collection Time: 10/15/16  8:35 PM  Result Value Ref Range   Yeast Wet Prep HPF POC NONE SEEN NONE SEEN   Trich, Wet Prep NONE SEEN NONE SEEN   Clue Cells Wet Prep HPF POC NONE SEEN NONE SEEN   WBC, Wet Prep HPF POC MODERATE (A) NONE SEEN    Comment: MODERATE BACTERIA SEEN   Sperm NONE SEEN    Review of Systems  Constitutional: Negative for fever.  Genitourinary: Positive for dysuria and vaginal discharge. Negative for dyspareunia.   Physical Exam   Blood pressure 130/85, pulse 112, temperature 99.4 F (37.4 C), temperature source Oral, resp. rate 16, height 5\' 4"  (1.626 m), weight  204 lb 12.8 oz (92.9 kg), last menstrual period 10/09/2016, SpO2 98 %.  Physical Exam  Constitutional: She is oriented to person, place, and time. She appears well-developed and well-nourished.  HENT:  Head: Normocephalic.  Eyes: Pupils are equal, round, and reactive to light.  Neck: Normal range of motion.  Respiratory: Effort normal.  GI: Soft.  Genitourinary:  Genitourinary Comments: Wet prep and GCC collected without speculum   Musculoskeletal: Normal range of motion.  Neurological: She is alert and oriented to person, place, and time.  Skin: Skin is warm.  Psychiatric: Her behavior is normal.    MAU Course  Procedures  None  MDM  Wet prep & GC collected RPR & HIV  Assessment and Plan   A:  1. Acute UTI   2. Vaginal discharge     P:  Discharge home in stable condition Return to MAU for emergencies only Follow up with the HD as needed.    Duane LopeJennifer I Kemani Demarais, NP 10/15/2016 9:18 PM

## 2016-10-15 NOTE — MAU Note (Signed)
Vag d/c since Fri. Pinkish. Just finished cycle on Thurs. Now more d/c that is white without foul odor. Lower back pain

## 2016-10-16 LAB — HIV ANTIBODY (ROUTINE TESTING W REFLEX): HIV Screen 4th Generation wRfx: NONREACTIVE

## 2016-10-16 LAB — RPR: RPR: NONREACTIVE

## 2016-10-17 ENCOUNTER — Encounter (HOSPITAL_COMMUNITY): Payer: Self-pay | Admitting: Emergency Medicine

## 2016-10-17 DIAGNOSIS — F17200 Nicotine dependence, unspecified, uncomplicated: Secondary | ICD-10-CM | POA: Insufficient documentation

## 2016-10-17 DIAGNOSIS — T450X5A Adverse effect of antiallergic and antiemetic drugs, initial encounter: Secondary | ICD-10-CM | POA: Diagnosis not present

## 2016-10-17 DIAGNOSIS — L233 Allergic contact dermatitis due to drugs in contact with skin: Secondary | ICD-10-CM | POA: Insufficient documentation

## 2016-10-17 DIAGNOSIS — N10 Acute tubulo-interstitial nephritis: Secondary | ICD-10-CM | POA: Insufficient documentation

## 2016-10-17 DIAGNOSIS — R39198 Other difficulties with micturition: Secondary | ICD-10-CM | POA: Diagnosis present

## 2016-10-17 LAB — GC/CHLAMYDIA PROBE AMP (~~LOC~~) NOT AT ARMC
CHLAMYDIA, DNA PROBE: NEGATIVE
Neisseria Gonorrhea: NEGATIVE

## 2016-10-17 NOTE — ED Triage Notes (Signed)
Pt st's she was [facility] and started on Septra.  Pt st's last pm she started itching all over.  Now pt has rash all over her body with itching.  No resp distress present

## 2016-10-18 ENCOUNTER — Emergency Department (HOSPITAL_COMMUNITY)
Admission: EM | Admit: 2016-10-18 | Discharge: 2016-10-18 | Disposition: A | Discharge status: Home / Self Care | Payer: Medicaid Other | Attending: Emergency Medicine | Admitting: Emergency Medicine

## 2016-10-18 DIAGNOSIS — N10 Acute tubulo-interstitial nephritis: Secondary | ICD-10-CM

## 2016-10-18 DIAGNOSIS — L270 Generalized skin eruption due to drugs and medicaments taken internally: Secondary | ICD-10-CM

## 2016-10-18 LAB — URINALYSIS, ROUTINE W REFLEX MICROSCOPIC
Bilirubin Urine: NEGATIVE
Glucose, UA: NEGATIVE mg/dL
KETONES UR: 5 mg/dL — AB
Nitrite: NEGATIVE
PH: 5 (ref 5.0–8.0)
Protein, ur: 100 mg/dL — AB
Specific Gravity, Urine: 1.031 — ABNORMAL HIGH (ref 1.005–1.030)

## 2016-10-18 LAB — CBC WITH DIFFERENTIAL/PLATELET
Basophils Absolute: 0 10*3/uL (ref 0.0–0.1)
Basophils Relative: 1 %
Eosinophils Absolute: 0.1 10*3/uL (ref 0.0–0.7)
Eosinophils Relative: 4 %
HEMATOCRIT: 39.9 % (ref 36.0–46.0)
Hemoglobin: 13.5 g/dL (ref 12.0–15.0)
Lymphocytes Relative: 20 %
Lymphs Abs: 0.6 10*3/uL — ABNORMAL LOW (ref 0.7–4.0)
MCH: 29.7 pg (ref 26.0–34.0)
MCHC: 33.8 g/dL (ref 30.0–36.0)
MCV: 87.9 fL (ref 78.0–100.0)
MONO ABS: 0.3 10*3/uL (ref 0.1–1.0)
MONOS PCT: 10 %
NEUTROS ABS: 1.8 10*3/uL (ref 1.7–7.7)
Neutrophils Relative %: 65 %
Platelets: 269 10*3/uL (ref 150–400)
RBC: 4.54 MIL/uL (ref 3.87–5.11)
RDW: 13 % (ref 11.5–15.5)
WBC: 2.8 10*3/uL — ABNORMAL LOW (ref 4.0–10.5)

## 2016-10-18 LAB — BASIC METABOLIC PANEL
Anion gap: 11 (ref 5–15)
BUN: 8 mg/dL (ref 6–20)
CALCIUM: 8.7 mg/dL — AB (ref 8.9–10.3)
CO2: 22 mmol/L (ref 22–32)
CREATININE: 0.87 mg/dL (ref 0.44–1.00)
Chloride: 99 mmol/L — ABNORMAL LOW (ref 101–111)
GFR calc Af Amer: >60 mL/min (ref >60)
GFR calc non Af Amer: >60 mL/min (ref >60)
GLUCOSE: 96 mg/dL (ref 65–99)
Potassium: 3.9 mmol/L (ref 3.5–5.1)
Sodium: 132 mmol/L — ABNORMAL LOW (ref 135–145)

## 2016-10-18 MED ORDER — PREDNISONE 20 MG PO TABS
60.0000 mg | ORAL_TABLET | Freq: Once | ORAL | Status: AC
  Administered 2016-10-18: 60 mg via ORAL
  Filled 2016-10-18: qty 3

## 2016-10-18 MED ORDER — DIPHENHYDRAMINE HCL 25 MG PO CAPS
25.0000 mg | ORAL_CAPSULE | Freq: Once | ORAL | Status: AC
  Administered 2016-10-18: 25 mg via ORAL
  Filled 2016-10-18: qty 1

## 2016-10-18 MED ORDER — FAMOTIDINE 20 MG PO TABS
10.0000 mg | ORAL_TABLET | Freq: Once | ORAL | Status: AC
  Administered 2016-10-18: 10 mg via ORAL
  Filled 2016-10-18: qty 1

## 2016-10-18 MED ORDER — LIDOCAINE HCL (PF) 1 % IJ SOLN
INTRAMUSCULAR | Status: AC
  Administered 2016-10-18: 2 mL
  Filled 2016-10-18: qty 5

## 2016-10-18 MED ORDER — FAMOTIDINE 20 MG PO TABS: 20.0000 mg | ORAL_TABLET | Freq: Every day | ORAL | 0 refills | Status: DC

## 2016-10-18 MED ORDER — CEFTRIAXONE SODIUM 1 G IJ SOLR
1.0000 g | Freq: Once | INTRAMUSCULAR | Status: AC
  Administered 2016-10-18: 1 g via INTRAMUSCULAR
  Filled 2016-10-18: qty 10

## 2016-10-18 MED ORDER — PREDNISONE 20 MG PO TABS: 40.0000 mg | ORAL_TABLET | Freq: Every day | ORAL | 0 refills | Status: DC

## 2016-10-18 MED ORDER — DIPHENHYDRAMINE HCL 25 MG PO CAPS: 25.0000 mg | ORAL_CAPSULE | Freq: Four times a day (QID) | ORAL | 0 refills | Status: DC | PRN

## 2016-10-18 MED ORDER — LIDOCAINE HCL (PF) 1 % IJ SOLN
2.0000 mL | Freq: Once | INTRAMUSCULAR | Status: AC
  Administered 2016-10-18: 2 mL

## 2016-10-18 MED ORDER — CEPHALEXIN 500 MG PO CAPS: 500.0000 mg | ORAL_CAPSULE | Freq: Three times a day (TID) | ORAL | 0 refills | Status: DC

## 2016-10-18 NOTE — ED Provider Notes (Signed)
[facility] Provider Note   CSN: [id number] Arrival date & time: 10/17/16  2134 By signing my name below, I, Levon Hedger, attest that this documentation has been prepared under the direction and in the presence of Shon Baton, MD . Electronically Signed: Levon Hedger, Scribe. 10/18/2016. 1:30 AM.   History   Chief Complaint Chief Complaint  Patient presents with  . Allergic Reaction    HPI Heather Casey is a 26 y.o. female who presents to the Emergency Department complaining of gradually worsening, itching, diffuse rash onset last night. Pt states she was seen at [facility] on 10/16/15 where she was diagnosed with a UTI and started on Bactrim. Per pt, she has never taken Bactrim before. Pt has taken Children's Benadryl and applied OTC topical ointment with no relief of itching. Pt notes associated fever. She also reports difficulty urinating and back pain which has improved somewhat since starting her abx. She denies any SOB, CP, nausea, vomiting, or any respiratory complaints. Pt has no other complaints or symptoms at this time.   The history is provided by the patient. No language interpreter was used.    Past Medical History:  Diagnosis Date  . Medical history non-contributory     There are no active problems to display for this patient.   Past Surgical History:  Procedure Laterality Date  . NO PAST SURGERIES      OB History    Gravida Para Term Preterm AB Living   3 1 1   2 1    SAB TAB Ectopic Multiple Live Births     2     1       Home Medications    Prior to Admission medications   Medication Sig Start Date End Date Taking? Authorizing Provider  acetaminophen (TYLENOL) 500 MG tablet Take 1,000 mg by mouth every 6 (six) hours as needed for mild pain.   Yes Historical Provider, MD  cephALEXin (KEFLEX) 500 MG capsule Take 1 capsule (500 mg total) by mouth 3 (three) times daily. 10/18/16   Shon Baton, MD  diphenhydrAMINE (BENADRYL) 25 mg  capsule Take 1 capsule (25 mg total) by mouth every 6 (six) hours as needed. 10/18/16   Shon Baton, MD  famotidine (PEPCID) 20 MG tablet Take 1 tablet (20 mg total) by mouth daily. 10/18/16   Shon Baton, MD  predniSONE (DELTASONE) 20 MG tablet Take 2 tablets (40 mg total) by mouth daily. 10/18/16   Shon Baton, MD  sulfamethoxazole-trimethoprim (BACTRIM DS,SEPTRA DS) 800-160 MG tablet Take 1 tablet by mouth 2 (two) times daily. 10/15/16 10/18/16  Duane Lope, NP    Family History Family History  Problem Relation Age of Onset  . Hypertension Mother     Social History Social History  Substance Use Topics  . Smoking status: Light Tobacco Smoker    Packs/day: 0.25  . Smokeless tobacco: Never Used  . Alcohol use Yes     Comment: socially     Allergies   Bactrim [sulfamethoxazole-trimethoprim] and Penicillins   Review of Systems Review of Systems  Constitutional: Positive for fever.  Respiratory: Negative for shortness of breath.   Cardiovascular: Negative for chest pain.  Gastrointestinal: Negative for nausea and vomiting.  Genitourinary: Positive for difficulty urinating.  Musculoskeletal: Positive for back pain.  Skin: Positive for rash.  All other systems reviewed and are negative.  Physical Exam Updated Vital Signs BP 110/57   Pulse 107   Temp 100.5 F (38.1 C) (  Oral)   Resp 20   Ht 5\' 4"  (1.626 m)   Wt 204 lb (92.5 kg)   LMP 10/09/2016 (Exact Date)   SpO2 96%   BMI 35.02 kg/m   Physical Exam  Constitutional: She is oriented to person, place, and time. She appears well-developed and well-nourished. No distress.  HENT:  Head: Normocephalic and atraumatic.  No oral lesions noted, no tongue swelling  Cardiovascular: Normal rate, regular rhythm and normal heart sounds.   No murmur heard. Pulmonary/Chest: Effort normal and breath sounds normal. No respiratory distress. She has no wheezes.  No wheezing noted  Abdominal: Soft. Bowel sounds are  normal. There is no tenderness. There is no guarding.  Neurological: She is alert and oriented to person, place, and time.  Skin: Skin is warm and dry. Rash noted.  Diffuse papular rash noted over the face, trunk, upper extremities and proximal lower extremities  Psychiatric: She has a normal mood and affect.  Nursing note and vitals reviewed.   ED Treatments / Results  DIAGNOSTIC STUDIES:  Oxygen Saturation is 99% on RA, normal by my interpretation.    COORDINATION OF CARE:  1:26 AM Discussed treatment plan with pt at bedside and pt agreed to plan.  Labs (all labs ordered are listed, but only abnormal results are displayed) Labs Reviewed  URINALYSIS, ROUTINE W REFLEX MICROSCOPIC - Abnormal; Notable for the following:       Result Value   Color, Urine AMBER (*)    APPearance CLOUDY (*)    Specific Gravity, Urine 1.031 (*)    Hgb urine dipstick MODERATE (*)    Ketones, ur 5 (*)    Protein, ur 100 (*)    Leukocytes, UA LARGE (*)    Bacteria, UA MANY (*)    Squamous Epithelial / LPF 6-30 (*)    All other components within normal limits  CBC WITH DIFFERENTIAL/PLATELET - Abnormal; Notable for the following:    WBC 2.8 (*)    Lymphs Abs 0.6 (*)    All other components within normal limits  BASIC METABOLIC PANEL - Abnormal; Notable for the following:    Sodium 132 (*)    Chloride 99 (*)    Calcium 8.7 (*)    All other components within normal limits  URINE CULTURE    EKG  EKG Interpretation None       Radiology No results found.  Procedures Procedures (including critical care time)  Medications Ordered in ED Medications  famotidine (PEPCID) tablet 10 mg (10 mg Oral Given 10/18/16 0144)  diphenhydrAMINE (BENADRYL) capsule 25 mg (25 mg Oral Given 10/18/16 0144)  predniSONE (DELTASONE) tablet 60 mg (60 mg Oral Given 10/18/16 0144)  cefTRIAXone (ROCEPHIN) injection 1 g (1 g Intramuscular Given 10/18/16 0321)  lidocaine (PF) (XYLOCAINE) 1 % injection 2 mL (2 mLs  Infiltration Given 10/18/16 0321)     Initial Impression / Assessment and Plan / ED Course  I have reviewed the triage vital signs and the nursing notes.  Pertinent labs & imaging results that were available during my care of the patient were reviewed by me and considered in my medical decision making (see chart for details).     Patient presents with a rash.  Most suspicious for drug rash. No other signs or symptoms of allergic reaction or anaphylaxis. Patient was given prednisone, Benadryl, Pepcid. She has evidence of persistent urinary tract infection. Urine culture sent. She does have a low-grade fever to 100.5. Basic labwork was obtained. Leukopenia noted with mild  hyponatremia. Otherwise lab work is reassuring. Patient with history of penicillin allergy. She states that she is unsure of what the allergy was but it was when she was a child. She is given IM Rocephin and tolerated this without difficulty. Will treat with Keflex 3 times a day. Pending urine culture. Patient was given strict return precautions including worsening rash, involvement of mucous membranes, worsening fever or any new or worsening symptoms.  After history, exam, and medical workup I feel the patient has been appropriately medically screened and is safe for discharge home. Pertinent diagnoses were discussed with the patient. Patient was given return precautions.   Final Clinical Impressions(s) / ED Diagnoses   Final diagnoses:  Drug rash  Acute pyelonephritis    New Prescriptions New Prescriptions   CEPHALEXIN (KEFLEX) 500 MG CAPSULE    Take 1 capsule (500 mg total) by mouth 3 (three) times daily.   DIPHENHYDRAMINE (BENADRYL) 25 MG CAPSULE    Take 1 capsule (25 mg total) by mouth every 6 (six) hours as needed.   FAMOTIDINE (PEPCID) 20 MG TABLET    Take 1 tablet (20 mg total) by mouth daily.   PREDNISONE (DELTASONE) 20 MG TABLET    Take 2 tablets (40 mg total) by mouth daily.   I personally performed the  services described in this documentation, which was scribed in my presence. The recorded information has been reviewed and is accurate.    Shon Batonourtney F Shoshannah Faubert, MD 10/18/16 [phone]

## 2016-10-18 NOTE — Discharge Instructions (Signed)
You were seen today for likely an allergic reaction to Bactrim. You will be placed on steroids, Pepcid, and Benadryl for the rash. This may persist for several more days to a week. If you develop shortness of breath, tongue swelling, oral lesions need to be evaluated immediately. You do have evidence of persistent urinary tract infection. You may also have early pyelonephritis. He will be started on a different antibiotic. If you develop fevers, worsening pain, any new or worsening symptoms you need to be reevaluated.

## 2016-10-18 NOTE — ED Notes (Signed)
Handed out warm blankets in lobby.  Patient's condition unchanged, patient denies any airway compromise.

## 2016-10-19 LAB — URINE CULTURE

## 2017-02-12 ENCOUNTER — Emergency Department (HOSPITAL_COMMUNITY): Payer: Medicaid Other

## 2017-02-12 ENCOUNTER — Emergency Department (HOSPITAL_COMMUNITY)
Admission: EM | Admit: 2017-02-12 | Discharge: 2017-02-13 | Disposition: A | Discharge status: Home / Self Care | Payer: Medicaid Other | Attending: Emergency Medicine | Admitting: Emergency Medicine

## 2017-02-12 ENCOUNTER — Encounter (HOSPITAL_COMMUNITY): Payer: Self-pay

## 2017-02-12 DIAGNOSIS — F17200 Nicotine dependence, unspecified, uncomplicated: Secondary | ICD-10-CM | POA: Insufficient documentation

## 2017-02-12 DIAGNOSIS — Z7982 Long term (current) use of aspirin: Secondary | ICD-10-CM | POA: Insufficient documentation

## 2017-02-12 DIAGNOSIS — M62838 Other muscle spasm: Secondary | ICD-10-CM | POA: Insufficient documentation

## 2017-02-12 DIAGNOSIS — R51 Headache: Secondary | ICD-10-CM | POA: Diagnosis present

## 2017-02-12 DIAGNOSIS — R0789 Other chest pain: Secondary | ICD-10-CM | POA: Diagnosis not present

## 2017-02-12 DIAGNOSIS — G44209 Tension-type headache, unspecified, not intractable: Secondary | ICD-10-CM | POA: Insufficient documentation

## 2017-02-12 DIAGNOSIS — M542 Cervicalgia: Secondary | ICD-10-CM

## 2017-02-12 LAB — BASIC METABOLIC PANEL
Anion gap: 8 (ref 5–15)
BUN: 5 mg/dL — ABNORMAL LOW (ref 6–20)
CALCIUM: 9.1 mg/dL (ref 8.9–10.3)
CO2: 21 mmol/L — AB (ref 22–32)
CREATININE: 0.68 mg/dL (ref 0.44–1.00)
Chloride: 107 mmol/L (ref 101–111)
GFR calc Af Amer: >60 mL/min (ref >60)
Glucose, Bld: 103 mg/dL — ABNORMAL HIGH (ref 65–99)
Potassium: 3.4 mmol/L — ABNORMAL LOW (ref 3.5–5.1)
Sodium: 136 mmol/L (ref 135–145)

## 2017-02-12 LAB — CBC
HCT: 39.3 % (ref 36.0–46.0)
Hemoglobin: 13.3 g/dL (ref 12.0–15.0)
MCH: 29.8 pg (ref 26.0–34.0)
MCHC: 33.8 g/dL (ref 30.0–36.0)
MCV: 87.9 fL (ref 78.0–100.0)
Platelets: 424 K/uL — ABNORMAL HIGH (ref 150–400)
RBC: 4.47 MIL/uL (ref 3.87–5.11)
RDW: 12.5 % (ref 11.5–15.5)
WBC: 4.7 K/uL (ref 4.0–10.5)

## 2017-02-12 LAB — I-STAT TROPONIN, ED: Troponin i, poc: 0 ng/mL (ref 0.00–0.08)

## 2017-02-12 NOTE — ED Triage Notes (Signed)
Pt complaining of headache and L neck pain. Pt states some exertional dyspnea. Pt tachy at triage HR = 135. Pt denies any chest pain.

## 2017-02-12 NOTE — ED Notes (Signed)
Patient transported to X-ray 

## 2017-02-13 ENCOUNTER — Emergency Department (HOSPITAL_COMMUNITY): Payer: Medicaid Other

## 2017-02-13 LAB — D-DIMER, QUANTITATIVE: D-Dimer, Quant: 0.58 ug/mL-FEU — ABNORMAL HIGH (ref 0.00–0.50)

## 2017-02-13 MED ORDER — DEXAMETHASONE SODIUM PHOSPHATE 10 MG/ML IJ SOLN
10.0000 mg | Freq: Once | INTRAMUSCULAR | Status: DC
Start: 2017-02-13 — End: 2017-02-13
  Filled 2017-02-13: qty 1

## 2017-02-13 MED ORDER — ACETAMINOPHEN 500 MG PO TABS
1000.0000 mg | ORAL_TABLET | Freq: Once | ORAL | Status: AC
  Administered 2017-02-13: 1000 mg via ORAL
  Filled 2017-02-13: qty 2

## 2017-02-13 MED ORDER — IBUPROFEN 400 MG PO TABS: 600.0000 mg | ORAL_TABLET | Freq: Once | ORAL | Status: DC

## 2017-02-13 MED ORDER — IOPAMIDOL (ISOVUE-370) INJECTION 76%
INTRAVENOUS | Status: AC
  Administered 2017-02-13: 100 mL
  Filled 2017-02-13: qty 100

## 2017-02-13 MED ORDER — METHOCARBAMOL 500 MG PO TABS: 500.0000 mg | ORAL_TABLET | Freq: Two times a day (BID) | ORAL | 0 refills | Status: DC

## 2017-02-13 NOTE — ED Provider Notes (Signed)
[facility] Provider Note   CSN: [id number] Arrival date & time: 02/12/17  2316     History   Chief Complaint Chief Complaint  Patient presents with  . Tachycardia  . Headache  . Neck Pain    HPI Heather Casey is a 26 y.o. female.  The patient presents with multiple complaints. She reports pain in the left side of her neck that is affected by position. The pain extends to the trapezius area. No numbness or weakness of LUE. It oves into the left posterior scalp as well causing headache. It has moved forward to the left frontal area during the course of symptoms. She has taken Goody powders with relief of headache but not neck pain.  She is also having central chest tightness that is worse with deep breath. This started tonight. No cough or fever. No diaphoresis. She reports exertional, very mild dyspnea.    The history is provided by the patient. No language interpreter was used.  Headache   Associated symptoms include shortness of breath. Pertinent negatives include no fever, no palpitations and no vomiting.  Neck Pain   Associated symptoms include headaches. Pertinent negatives include no numbness and no weakness.    Past Medical History:  Diagnosis Date  . Medical history non-contributory     There are no active problems to display for this patient.   Past Surgical History:  Procedure Laterality Date  . NO PAST SURGERIES      OB History    Gravida Para Term Preterm AB Living   3 1 1   2 1    SAB TAB Ectopic Multiple Live Births     2     1       Home Medications    Prior to Admission medications   Medication Sig Start Date End Date Taking? Authorizing Provider  Aspirin-Acetaminophen-Caffeine (GOODY HEADACHE PO) Take 1 Package by mouth as needed (for heacache).   Yes [provider]  cephALEXin (KEFLEX) 500 MG capsule Take 1 capsule (500 mg total) by mouth 3 (three) times daily. Patient not taking: Reported on 02/13/2017 10/18/16   Horton,  Mayer Masker, MD  diphenhydrAMINE (BENADRYL) 25 mg capsule Take 1 capsule (25 mg total) by mouth every 6 (six) hours as needed. Patient not taking: Reported on 02/13/2017 10/18/16   Horton, Mayer Masker, MD  famotidine (PEPCID) 20 MG tablet Take 1 tablet (20 mg total) by mouth daily. Patient not taking: Reported on 02/13/2017 10/18/16   Horton, Mayer Masker, MD  predniSONE (DELTASONE) 20 MG tablet Take 2 tablets (40 mg total) by mouth daily. Patient not taking: Reported on 02/13/2017 10/18/16   Horton, Mayer Masker, MD    Family History Family History  Problem Relation Age of Onset  . Hypertension Mother     Social History Social History  Substance Use Topics  . Smoking status: Light Tobacco Smoker    Packs/day: 0.25  . Smokeless tobacco: Never Used  . Alcohol use Yes     Comment: socially     Allergies   Bactrim [sulfamethoxazole-trimethoprim] and Penicillins   Review of Systems Review of Systems  Constitutional: Negative for chills, diaphoresis and fever.  HENT: Negative.   Respiratory: Positive for chest tightness and shortness of breath. Negative for cough.   Cardiovascular: Negative.  Negative for palpitations and leg swelling.  Gastrointestinal: Negative.  Negative for abdominal pain and vomiting.  Musculoskeletal: Positive for neck pain.  Neurological: Positive for headaches. Negative for weakness and numbness.  Physical Exam Updated Vital Signs BP 126/83   Pulse (!) 126   Temp 99.6 F (37.6 C)   Resp 20   LMP 01/18/2017 (Approximate)   SpO2 100%   Physical Exam  Constitutional: She is oriented to person, place, and time. She appears well-developed and well-nourished.  HENT:  Head: Normocephalic.  Eyes: Pupils are equal, round, and reactive to light.  Neck: Normal range of motion. Neck supple.  Cardiovascular: Normal rate and regular rhythm.   Pulmonary/Chest: Effort normal and breath sounds normal.  Abdominal: Soft. Bowel sounds are normal. There is no  tenderness. There is no rebound and no guarding.  Musculoskeletal: Normal range of motion.  Neurological: She is alert and oriented to person, place, and time. She has normal strength and normal reflexes. No sensory deficit. She displays a negative Romberg sign. Coordination normal.  CN's 3-12 grossly intact. Speech is clear and focused. No facial asymmetry. No lateralizing weakness.  No deficits of coordination. Ambulatory without imbalance.    Skin: Skin is warm and dry. No rash noted.  Psychiatric: She has a normal mood and affect.     ED Treatments / Results  Labs (all labs ordered are listed, but only abnormal results are displayed) Labs Reviewed  BASIC METABOLIC PANEL - Abnormal; Notable for the following:       Result Value   Potassium 3.4 (*)    CO2 21 (*)    Glucose, Bld 103 (*)    BUN 5 (*)    All other components within normal limits  CBC - Abnormal; Notable for the following:    Platelets 424 (*)    All other components within normal limits  D-DIMER, QUANTITATIVE (NOT [facility])  I-STAT TROPOININ, ED    EKG  EKG Interpretation  Date/Time:  Sunday February 12 2017 23:21:25 EDT Ventricular Rate:  119 PR Interval:  160 QRS Duration: 80 QT Interval:  324 QTC Calculation: 455 R Axis:   43 Text Interpretation:  Sinus tachycardia Nonspecific T wave abnormality Abnormal ECG No previous ECGs available Confirmed by Zadie RhineWickline, Donald ([id number]) on 02/13/2017 12:17:42 AM       Radiology Dg Chest 2 View  Result Date: 02/13/2017 CLINICAL DATA:  Shortness of breath and chest tightness. EXAM: CHEST  2 VIEW COMPARISON:  None. FINDINGS: The cardiomediastinal contours are normal. The lungs are clear. Pulmonary vasculature is normal. No consolidation, pleural effusion, or pneumothorax. No acute osseous abnormalities are seen. IMPRESSION: No acute pulmonary process. Electronically Signed   By: Rubye OaksMelanie  Ehinger M.D.   On: 02/13/2017 00:03    Procedures Procedures (including critical care  time)  Medications Ordered in ED Medications  acetaminophen (TYLENOL) tablet 1,000 mg (not administered)     Initial Impression / Assessment and Plan / ED Course  I have reviewed the triage vital signs and the nursing notes.  Pertinent labs & imaging results that were available during my care of the patient were reviewed by me and considered in my medical decision making (see chart for details).     Patient presents with multiple complaints: neck pain, headache and chest tightness.   She is significantly tachycardic on arrival and c/o dyspnea on exertion as well as chest tightness. She has risk factors for PE and cannot PERC out. CT angio performed and is negative for PE.   Neck pain is clearly musculoskeletal with reproducible tenderness and mass c/w spasm.   Headache felt secondary to neck pain, tension type HA.  Final Clinical Impressions(s) / ED Diagnoses  Final diagnoses:  None   1. Muscular neck spasm 2. Chest tightness 3. Tension type HA  New Prescriptions New Prescriptions   No medications on file     Elpidio Anis, Cordelia Poche 02/13/17 1610    Zadie Rhine, MD 02/13/17 [phone]

## 2018-04-17 ENCOUNTER — Encounter (HOSPITAL_COMMUNITY): Payer: Self-pay | Admitting: Emergency Medicine

## 2018-04-17 ENCOUNTER — Ambulatory Visit (HOSPITAL_COMMUNITY)
Admission: EM | Admit: 2018-04-17 | Discharge: 2018-04-17 | Disposition: A | Discharge status: Home / Self Care | Payer: Self-pay

## 2018-04-17 DIAGNOSIS — Z882 Allergy status to sulfonamides status: Secondary | ICD-10-CM | POA: Insufficient documentation

## 2018-04-17 DIAGNOSIS — Z8249 Family history of ischemic heart disease and other diseases of the circulatory system: Secondary | ICD-10-CM | POA: Insufficient documentation

## 2018-04-17 DIAGNOSIS — Z880 Allergy status to penicillin: Secondary | ICD-10-CM | POA: Insufficient documentation

## 2018-04-17 DIAGNOSIS — J029 Acute pharyngitis, unspecified: Secondary | ICD-10-CM | POA: Insufficient documentation

## 2018-04-17 DIAGNOSIS — H9202 Otalgia, left ear: Secondary | ICD-10-CM | POA: Insufficient documentation

## 2018-04-17 DIAGNOSIS — F17210 Nicotine dependence, cigarettes, uncomplicated: Secondary | ICD-10-CM | POA: Insufficient documentation

## 2018-04-17 LAB — POCT RAPID STREP A: Streptococcus, Group A Screen (Direct): NEGATIVE

## 2018-04-17 MED ORDER — PREDNISONE 10 MG PO TABS: 20.0000 mg | ORAL_TABLET | Freq: Every day | ORAL | 0 refills | Status: AC

## 2018-04-17 NOTE — ED Provider Notes (Signed)
[facility]    CSN: [id number] Arrival date & time: 04/17/18  0808     History   Chief Complaint Chief Complaint  Patient presents with  . Sore Throat    HPI Heather Casey is a 27 y.o. female.   Subjective:   History was provided by the patient.  Heather BongoJanae C Hicklin is a 27 y.o. female who presents for evaluation of a sore throat. Associated symptoms include left ear pain. Onset of symptoms was 3 days ago and has been unchanged since that time. She is drinking plenty of fluids. She has not had any known recent close exposure to someone with proven streptococcal pharyngitis.  The following portions of the patient's history were reviewed and updated as appropriate: allergies, current medications, past family history, past medical history, past social history, past surgical history and problem list.       Past Medical History:  Diagnosis Date  . Medical history non-contributory     There are no active problems to display for this patient.   Past Surgical History:  Procedure Laterality Date  . NO PAST SURGERIES      OB History    Gravida  3   Para  1   Term  1   Preterm      AB  2   Living  1     SAB      TAB  2   Ectopic      Multiple      Live Births  1            Home Medications    Prior to Admission medications   Medication Sig Start Date End Date Taking? Authorizing Provider  Etonogestrel (NEXPLANON [place]) Inject into the skin.   Yes [provider]  Aspirin-Acetaminophen-Caffeine (GOODY HEADACHE PO) Take 1 Package by mouth as needed (for heacache).    [provider]  predniSONE (DELTASONE) 10 MG tablet Take 2 tablets (20 mg total) by mouth daily for 5 days. 04/17/18 04/22/18  Lurline IdolMurrill, Siya Flurry, FNP    Family History Family History  Problem Relation Age of Onset  . Hypertension Mother     Social History Social History   Tobacco Use  . Smoking status: Light Tobacco Smoker    Packs/day: 0.25  . Smokeless  tobacco: Never Used  Substance Use Topics  . Alcohol use: Yes    Comment: socially  . Drug use: No     Allergies   Bactrim [sulfamethoxazole-trimethoprim] and Penicillins   Review of Systems Review of Systems  Constitutional: Negative for fever.  HENT: Positive for ear pain and sore throat. Negative for congestion, rhinorrhea and trouble swallowing.   Respiratory: Negative for cough and shortness of breath.   Gastrointestinal: Negative for nausea and vomiting.  All other systems reviewed and are negative.    Physical Exam Triage Vital Signs ED Triage Vitals  Enc Vitals Group     BP 04/17/18 0847 127/88     Pulse Rate 04/17/18 0847 79     Resp 04/17/18 0847 16     Temp 04/17/18 0847 98.4 F (36.9 C)     Temp Source 04/17/18 0847 Oral     SpO2 04/17/18 0847 100 %     Weight --      Height --      Head Circumference --      Peak Flow --      Pain Score 04/17/18 0855 5     Pain Loc --  Pain Edu? --      Excl. in GC? --    No data found.  Updated Vital Signs BP 127/88 (BP Location: Left Arm)   Pulse 79   Temp 98.4 F (36.9 C) (Oral)   Resp 16   SpO2 100%   Visual Acuity Right Eye Distance:   Left Eye Distance:   Bilateral Distance:    Right Eye Near:   Left Eye Near:    Bilateral Near:     Physical Exam  Constitutional: She is oriented to person, place, and time. She appears well-developed and well-nourished.  Non-toxic appearance.  HENT:  Head: Normocephalic and atraumatic.  Right Ear: Tympanic membrane and ear canal normal.  Left Ear: Tympanic membrane and ear canal normal.  Mouth/Throat: Uvula is midline, oropharynx is clear and moist and mucous membranes are normal. No uvula swelling. No oropharyngeal exudate, posterior oropharyngeal edema, posterior oropharyngeal erythema or tonsillar abscesses. No tonsillar exudate.  Eyes: Pupils are equal, round, and reactive to light. EOM are normal.  Neck: Normal range of motion. Neck supple.    Cardiovascular: Normal rate and regular rhythm.  Pulmonary/Chest: Effort normal.  Lymphadenopathy:    She has no cervical adenopathy.  Neurological: She is alert and oriented to person, place, and time.  Skin: Skin is warm and dry.  Psychiatric: She has a normal mood and affect. Her behavior is normal.     UC Treatments / Results  Labs (all labs ordered are listed, but only abnormal results are displayed) Labs Reviewed  POCT RAPID STREP A    EKG None  Radiology No results found.  Procedures Procedures (including critical care time)  Medications Ordered in UC Medications - No data to display  Initial Impression / Assessment and Plan / UC Course  I have reviewed the triage vital signs and the nursing notes.  Pertinent labs & imaging results that were available during my care of the patient were reviewed by me and considered in my medical decision making (see chart for details).    27 year old female presenting with a 3-day history of sore throat.  Rapid strep negative.  Symptoms likely due to viral illness.  Supportive care advised at this time.   Plan: - Prednisone 20 mg QD x 5 days  - Use of OTC analgesics recommended as well as salt water gargles. - Follow up as needed.  Today's evaluation has revealed no signs of a dangerous process. Discussed diagnosis with patient. Patient aware of their diagnosis, possible red flag symptoms to watch out for and need for close follow up. Patient understands verbal and written discharge instructions. Patient comfortable with plan and disposition.  Patient has a clear mental status at this time, good insight into illness (after discussion and teaching) and has clear judgment to make decisions regarding their care.  Documentation was completed with the aid of voice recognition software. Transcription may contain typographical errors.  Final Clinical Impressions(s) / UC Diagnoses   Final diagnoses:  Pharyngitis, unspecified etiology    Discharge Instructions   None    ED Prescriptions    Medication Sig Dispense Auth. Provider   predniSONE (DELTASONE) 10 MG tablet Take 2 tablets (20 mg total) by mouth daily for 5 days. 10 tablet Lurline IdolMurrill, Deleah Tison, FNP     Controlled Substance Prescriptions [place] Controlled Substance Registry consulted? Not Applicable   Lurline IdolMurrill, Cyris Maalouf, FNP 04/17/18 1006

## 2018-04-17 NOTE — ED Triage Notes (Signed)
PT reports sore throat that started Friday night. PT reports possible post nasal drip as well.

## 2018-04-19 LAB — CULTURE, GROUP A STREP (THRC)

## 2018-07-26 ENCOUNTER — Other Ambulatory Visit: Payer: Self-pay

## 2018-07-26 ENCOUNTER — Emergency Department (HOSPITAL_COMMUNITY)
Admission: EM | Admit: 2018-07-26 | Discharge: 2018-07-26 | Disposition: A | Discharge status: Home / Self Care | Payer: Medicaid Other | Attending: Emergency Medicine | Admitting: Emergency Medicine

## 2018-07-26 ENCOUNTER — Encounter (HOSPITAL_COMMUNITY): Payer: Self-pay | Admitting: Emergency Medicine

## 2018-07-26 DIAGNOSIS — Z7982 Long term (current) use of aspirin: Secondary | ICD-10-CM | POA: Insufficient documentation

## 2018-07-26 DIAGNOSIS — N760 Acute vaginitis: Secondary | ICD-10-CM | POA: Insufficient documentation

## 2018-07-26 DIAGNOSIS — F17210 Nicotine dependence, cigarettes, uncomplicated: Secondary | ICD-10-CM | POA: Insufficient documentation

## 2018-07-26 DIAGNOSIS — B9689 Other specified bacterial agents as the cause of diseases classified elsewhere: Secondary | ICD-10-CM

## 2018-07-26 LAB — POC URINE PREG, ED: PREG TEST UR: NEGATIVE

## 2018-07-26 LAB — WET PREP, GENITAL
Sperm: NONE SEEN
Trich, Wet Prep: NONE SEEN
Yeast Wet Prep HPF POC: NONE SEEN

## 2018-07-26 MED ORDER — ACETAMINOPHEN 500 MG PO TABS
1000.0000 mg | ORAL_TABLET | Freq: Once | ORAL | Status: AC
  Administered 2018-07-26: 1000 mg via ORAL
  Filled 2018-07-26: qty 2

## 2018-07-26 MED ORDER — CLINDAMYCIN PHOSPHATE 2 % VA CREA: 1.0000 | TOPICAL_CREAM | Freq: Every day | VAGINAL | 0 refills | Status: DC

## 2018-07-26 MED ORDER — CLINDAMYCIN PHOSPHATE 2 % VA CREA: 1.0000 | TOPICAL_CREAM | Freq: Every day | VAGINAL | 0 refills | Status: AC

## 2018-07-26 MED ORDER — METRONIDAZOLE 500 MG PO TABS: 500.0000 mg | ORAL_TABLET | Freq: Two times a day (BID) | ORAL | 0 refills | Status: DC

## 2018-07-26 NOTE — ED Triage Notes (Addendum)
Pt reports "a fishy odor" after sex this morning.  Pt reports recent irregular period with clots.  Pt reports implanted birth control.

## 2018-07-26 NOTE — Discharge Instructions (Signed)
Flagyl is an antibiotic used to treat bacterial vaginosis. This medication CANNOT be taken with alcohol, because it can cause nausea and vomiting combined with alcohol. Please also refrain from drinking alcohol for 48 hours after you finish the medicine.  Weight 24 hours to start if you cannot get the clindamycin suppository filled.  You are also prescribed clindamycin suppository.  Do not take both the clindamycin and the Flagyl.  If the clindamycin is not covered by your insurance, you may fill the Flagyl prescription.  Use a condom with every sexual encounter Follow up with your doctor OBGYN in regards to today's visit.   Please return to the ER for worsening symptoms, high fevers or persistent vomiting.  You have been tested for HIV, syphilis, chlamydia and gonorrhea. These results will be available in approximately 3 days. You will be notified if they are positive.   It is very important to practice safe sex and use condoms when sexually active. If your results are positive you need to notify all sexual partners so they can be treated as well. The website https://garcia.net/http://www.dontspreadit.com/ can be used to send anonymous text messages or emails to alert sexual contacts.   SEEK IMMEDIATE MEDICAL CARE IF:  You develop an oral temperature above 102 F (38.9 C), not controlled by medications or lasting more than 2 days.  You develop an increase in pain.  You develop vaginal bleeding and it is not time for your period.  You develop painful intercourse.

## 2018-07-26 NOTE — ED Provider Notes (Signed)
[facility] EMERGENCY DEPARTMENT Provider Note   CSN: [id number] Arrival date & time: 07/26/18  4098     History   Chief Complaint No chief complaint on file.   HPI Heather Casey is a 27 y.o. female.  HPI   Patient is a 27 year old female with no significant past medical history presenting for abnormal vaginal odor.  Patient reports that after sexual intercourse this morning, she noticed a foul, "fishy" smelling odor from the vaginal region.  She denies any discharge.  She denies any pelvic pain, dyspareunia, dysuria, urgency, or frequency.  Patient denies any nausea or vomiting.  Patient reports she is sexually active with one female partner for the past 4 months.  She denies concerns about STI.  Patient denies history of STI.  LMP 1 week ago.  Patient reports that she "menstruated twice" this month.  Past Medical History:  Diagnosis Date  . Medical history non-contributory     There are no active problems to display for this patient.   Past Surgical History:  Procedure Laterality Date  . NO PAST SURGERIES       OB History    Gravida  3   Para  1   Term  1   Preterm      AB  2   Living  1     SAB      TAB  2   Ectopic      Multiple      Live Births  1            Home Medications    Prior to Admission medications   Medication Sig Start Date End Date Taking? Authorizing Provider  Aspirin-Acetaminophen-Caffeine (GOODY HEADACHE PO) Take 1 Package by mouth as needed (for heacache).    [provider]  Etonogestrel (NEXPLANON [place]) Inject into the skin.    [provider]    Family History Family History  Problem Relation Age of Onset  . Hypertension Mother     Social History Social History   Tobacco Use  . Smoking status: Light Tobacco Smoker    Packs/day: 0.25  . Smokeless tobacco: Never Used  Substance Use Topics  . Alcohol use: Yes    Comment: socially  . Drug use: No     Allergies   Bactrim  [sulfamethoxazole-trimethoprim] and Penicillins   Review of Systems Review of Systems  Gastrointestinal: Negative for abdominal pain, nausea and vomiting.  Genitourinary: Negative for dysuria, frequency, pelvic pain, urgency, vaginal bleeding, vaginal discharge and vaginal pain.       +Vaginal odor     Physical Exam Updated Vital Signs There were no vitals taken for this visit.  Physical Exam  Constitutional: She appears well-developed and well-nourished. No distress.  Sitting comfortably in bed.  HENT:  Head: Normocephalic and atraumatic.  Eyes: Conjunctivae are normal. Right eye exhibits no discharge. Left eye exhibits no discharge.  EOMs normal to gross examination.  Neck: Normal range of motion.  Cardiovascular: Normal rate and regular rhythm.  Intact, 2+ radial pulse.  Pulmonary/Chest:  Normal respiratory effort. Patient converses comfortably. No audible wheeze or stridor.  Abdominal: Soft. She exhibits no distension. There is no tenderness.  Genitourinary:  Genitourinary Comments: Pelvic examination performed with nurse tech chaperone present.  Patient has no inguinal lymphadenopathy of horizontal or vertical chains.  No external lesions of the vagina or perineum.  Vaginal tissue is rugated and pink.  Cervix is not erythematous and nonfriable.  Scant discharge within  vaginal vault. On bimanual exam, patient exhibits no cervical motion, adnexal, nor tenderness to uterine fundus.  Musculoskeletal: Normal range of motion.  Neurological: She is alert.  Cranial nerves intact to gross observation. Patient moves extremities without difficulty.  Skin: Skin is warm and dry. She is not diaphoretic.  Psychiatric: She has a normal mood and affect. Her behavior is normal. Judgment and thought content normal.  Nursing note and vitals reviewed.    ED Treatments / Results  Labs (all labs ordered are listed, but only abnormal results are displayed) Labs Reviewed  WET PREP, GENITAL -  Abnormal; Notable for the following components:      Result Value   Clue Cells Wet Prep HPF POC PRESENT (*)    WBC, Wet Prep HPF POC FEW (*)    All other components within normal limits  RPR  HIV ANTIBODY (ROUTINE TESTING W REFLEX)  POC URINE PREG, ED  GC/CHLAMYDIA PROBE AMP ([place]) NOT [facility]    EKG None  Radiology No results found.  Procedures Procedures (including critical care time)  Medications Ordered in ED Medications - No data to display   Initial Impression / Assessment and Plan / ED Course  I have reviewed the triage vital signs and the nursing notes.  Pertinent labs & imaging results that were available during my care of the patient were reviewed by me and considered in my medical decision making (see chart for details).  Clinical Course as of Jul 26 1032  Thu Jul 26, 2018  0810 Pulse rechecked and 97 on my evaluation.   [AM]    Clinical Course User Index [AM] Elisha PonderMurray, Alyssa B, PA-C    Patient nontoxic-appearing, afebrile, and with benign abdomen.  Patient had initial tachycardic reading presentation emergency department, resolved at rest.  Patient with a vaginal odor without vaginal discharge.  No pelvic pain.  Pelvic examination not concerning for cervicitis or PID.  Pregnancy test negative. Wet prep showing clue cells, consistent with abnormal vaginal odor.  Will treat for bacterial vaginosis.  Suspicion for cervicitis based on wet prep is low.  Patient instructed that she will receive a call and 48- 72 hours if any of her laboratory tests are positive.  Patient drink alcohol last night, reports that she still has alcohol in her system.  Discussed with patient that clindamycin cream suppositories will be preferable, however she is unsure if her insurance is fully reinstated.  Discussed with patient that she will need to wait 24 hours to start Flagyl should her insurance not cover the clindamycin, and discard the prescription she is not using.  Return  precautions given for any pelvic pain, dysuria, urgency, frequency, fevers or increased vaginal discharge.  Patient is in understanding and agrees with the plan of care.  Final Clinical Impressions(s) / ED Diagnoses   Final diagnoses:  BV (bacterial vaginosis)    ED Discharge Orders    None       Delia ChimesMurray, Alyssa B, PA-C 07/26/18 1045    Mesner, Barbara CowerJason, MD 07/27/18 1232

## 2018-07-27 LAB — HIV ANTIBODY (ROUTINE TESTING W REFLEX): HIV Screen 4th Generation wRfx: NONREACTIVE

## 2018-07-27 LAB — GC/CHLAMYDIA PROBE AMP (~~LOC~~) NOT AT ARMC
CHLAMYDIA, DNA PROBE: NEGATIVE
NEISSERIA GONORRHEA: NEGATIVE

## 2018-07-28 LAB — RPR: RPR Ser Ql: NONREACTIVE

## 2018-12-08 IMAGING — CT CT ANGIO CHEST
2 of 8 series · 18 of 46 positions shown · IV contrast (isovue)
Comparison: Chest radiograph 02/12/2017

CLINICAL DATA: Elevated D-dimer, dyspnea and tachycardia

EXAM:
CT ANGIOGRAPHY CHEST WITH CONTRAST
TECHNIQUE: Multidetector CT imaging of the chest was performed using the
standard protocol during bolus administration of intravenous
contrast. Multiplanar CT image reconstructions and MIPs were
obtained to evaluate the vascular anatomy.
CONTRAST:  100 mL Isovue 370

[Series 6: thins · axial · 0.66mm/px · z∈[+1185,+1402]mm · 15 of 239 slices shown]
[im 11/239  lung]
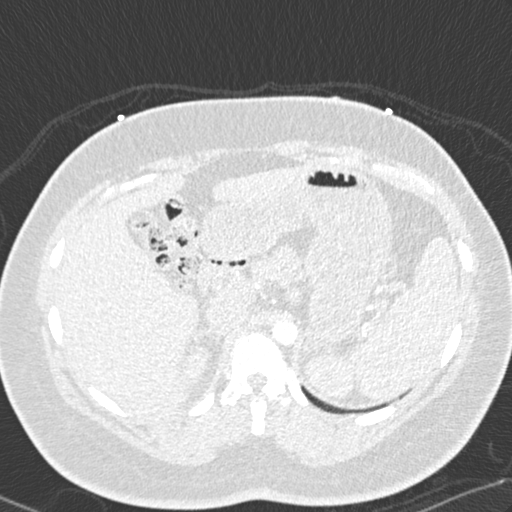
[im 33/239  soft-tissue]
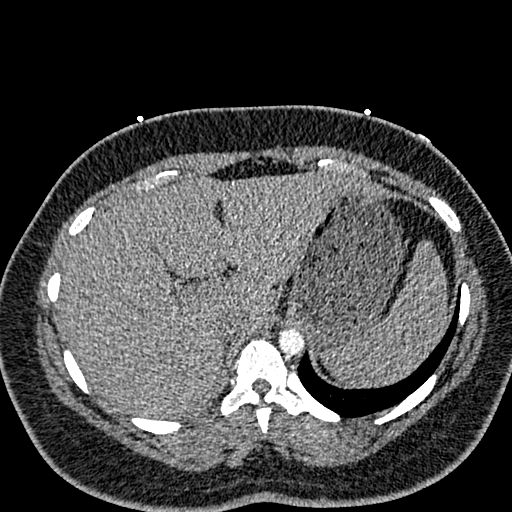
[im 44/239  lung]
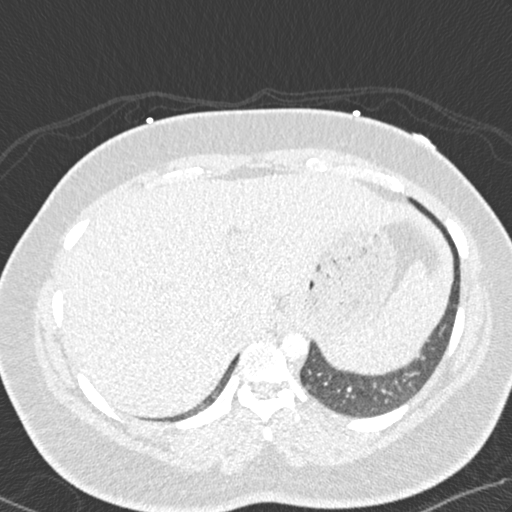
[im 55/239  soft-tissue]
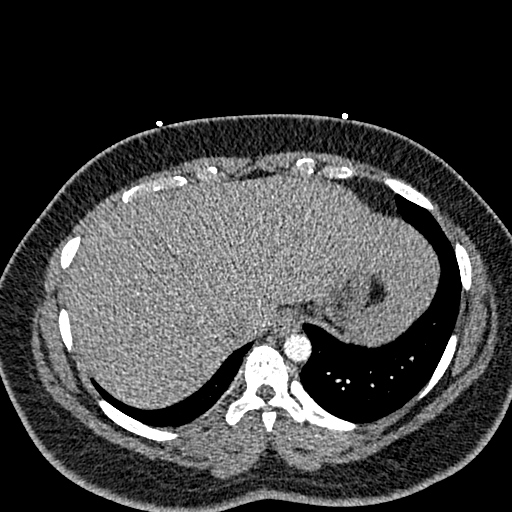
[im 76/239  lung]
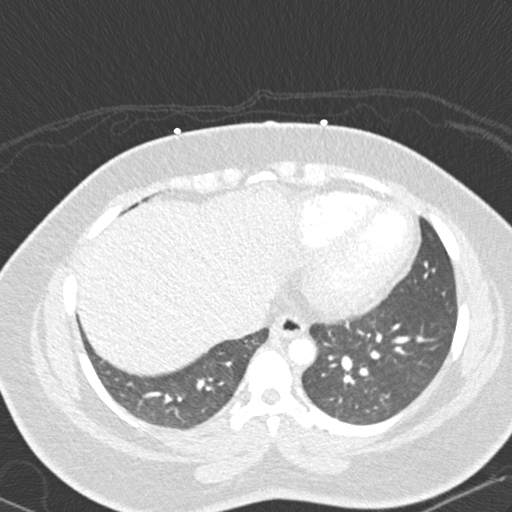
[im 87/239  soft-tissue]
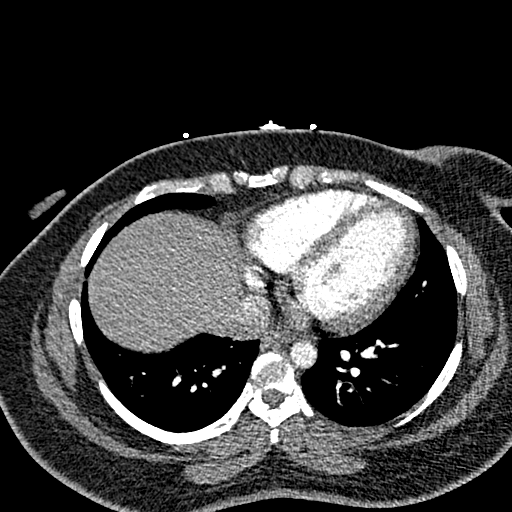
[im 109/239  lung]
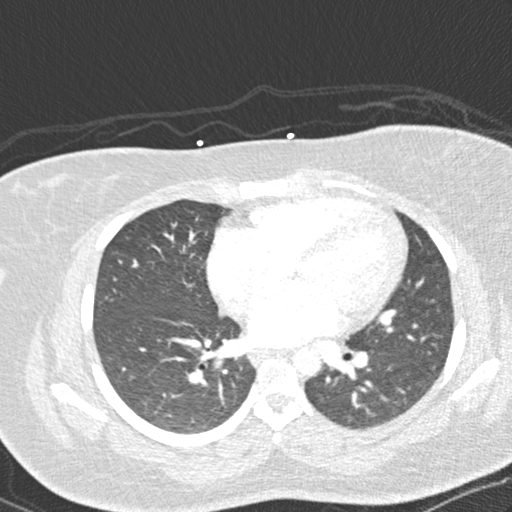
[im 120/239  soft-tissue]
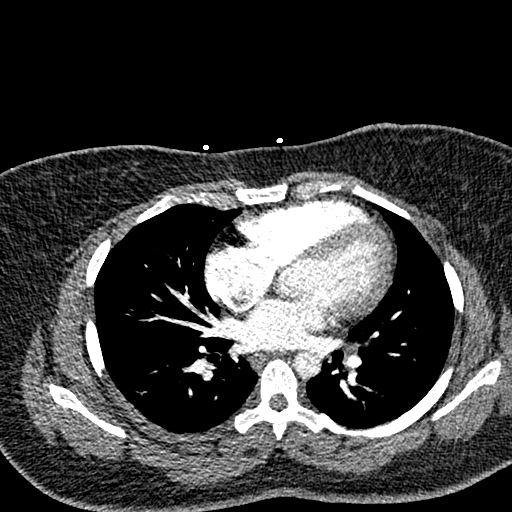
[im 130/239  lung]
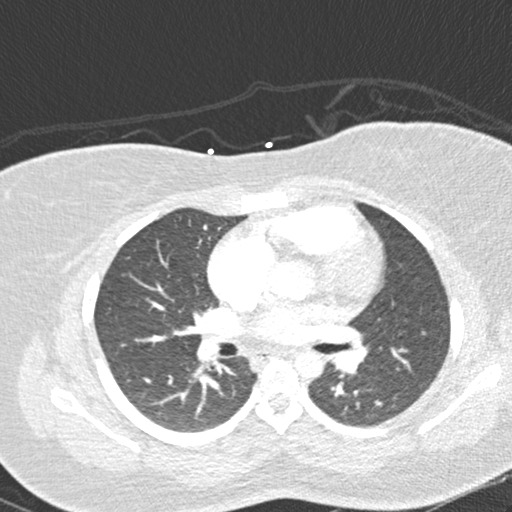
[im 152/239  soft-tissue]
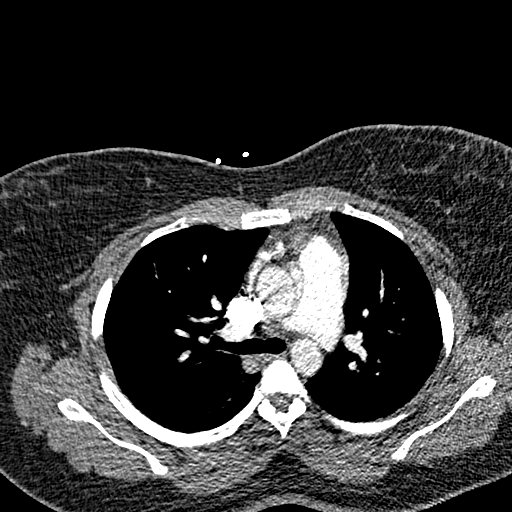
[im 163/239  lung]
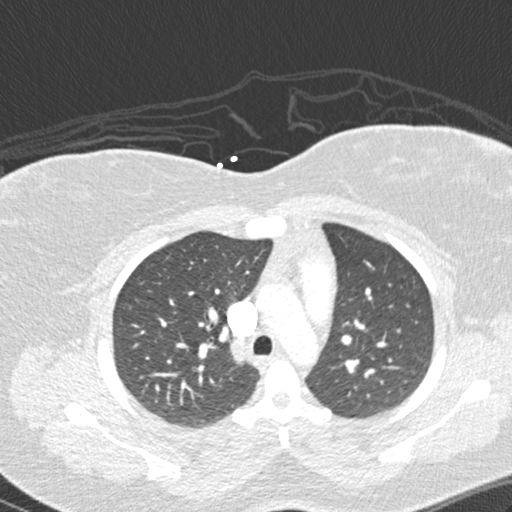
[im 184/239  soft-tissue]
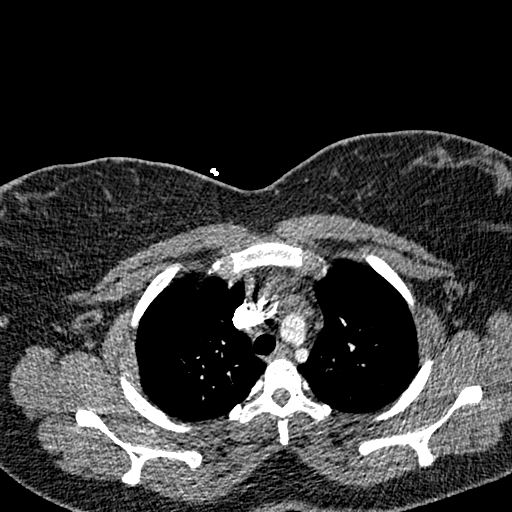
[im 195/239  lung]
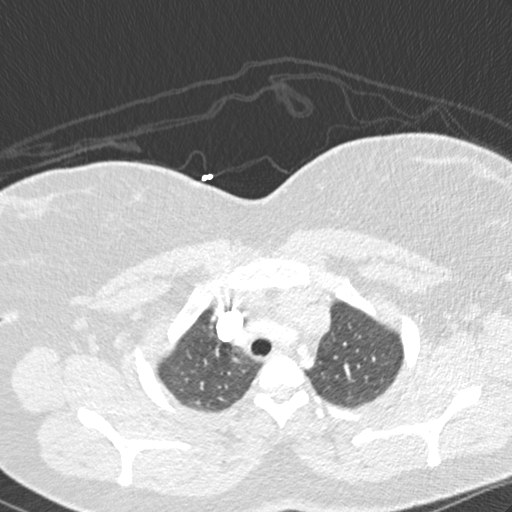
[im 206/239  soft-tissue]
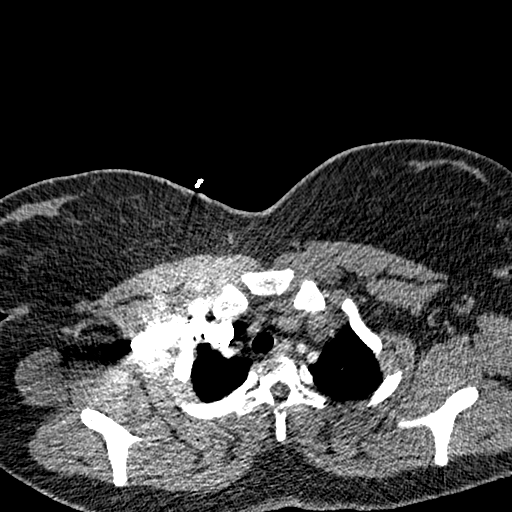
[im 228/239  lung]
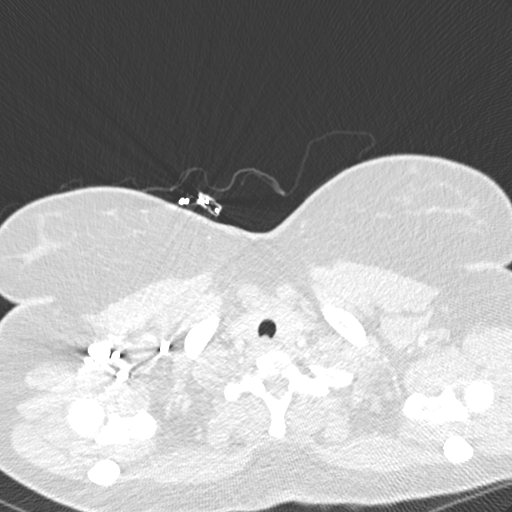

[Series 8: coronal mpr · coronal · 0.47mm/px · 3 of 114 slices shown]
[im 29/114  soft-tissue]
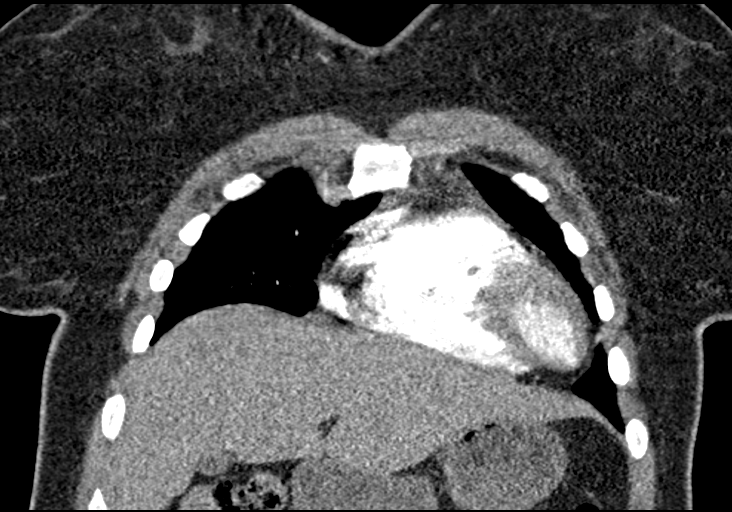
[im 57/114  soft-tissue]
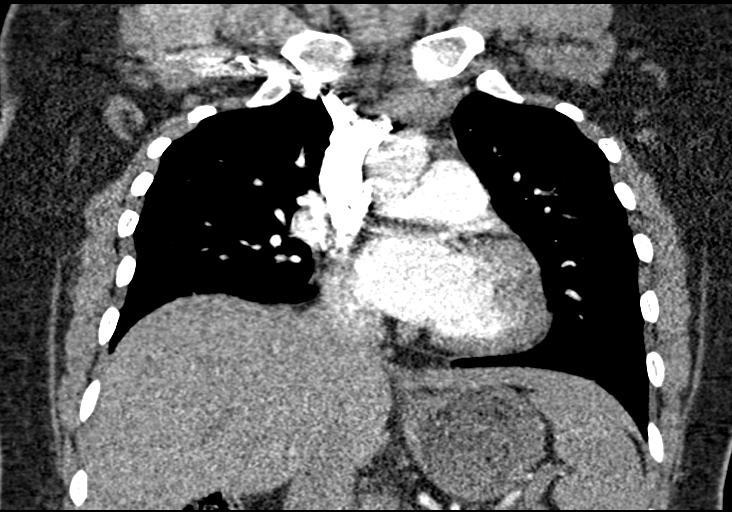
[im 85/114  soft-tissue]
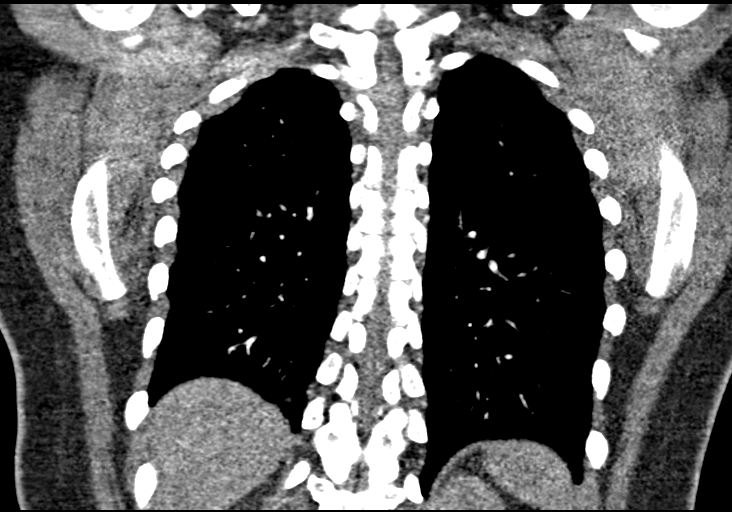

[18 of 46 positions shown; findings below may reference images not displayed]

FINDINGS: Cardiovascular: Contrast injection is sufficient to demonstrate
satisfactory opacification of the pulmonary arteries to the
segmental level. There is no pulmonary embolus. The main pulmonary
artery is upper limits of normal for size. There is no CT evidence
of acute right heart strain. The visualized aorta is normal. There
is a normal 3-vessel arch branching pattern. Heart size is normal,
without pericardial effusion.

Mediastinum/Nodes: No mediastinal, hilar or axillary
lymphadenopathy. The visualized thyroid and thoracic esophageal
course are unremarkable.

Lungs/Pleura: No pulmonary nodules or masses. No pleural effusion or
pneumothorax. No focal airspace consolidation. No focal pleural
abnormality.

Upper Abdomen: Contrast bolus timing is not optimized for evaluation
of the abdominal organs. Within this limitation, the visualized
organs of the upper abdomen are normal.

Musculoskeletal: No chest wall abnormality. No acute or significant
osseous findings.

Review of the MIP images confirms the above findings.
IMPRESSION: No pulmonary embolus or acute aortic syndrome.

## 2019-05-30 ENCOUNTER — Encounter (HOSPITAL_COMMUNITY): Payer: Self-pay | Admitting: Family Medicine

## 2019-05-30 ENCOUNTER — Ambulatory Visit (HOSPITAL_COMMUNITY)
Admission: EM | Admit: 2019-05-30 | Discharge: 2019-05-30 | Disposition: A | Discharge status: Home / Self Care | Payer: Medicaid Other | Attending: Family Medicine | Admitting: Family Medicine

## 2019-05-30 ENCOUNTER — Other Ambulatory Visit: Payer: Self-pay

## 2019-05-30 DIAGNOSIS — Z03818 Encounter for observation for suspected exposure to other biological agents ruled out: Secondary | ICD-10-CM | POA: Diagnosis not present

## 2019-05-30 DIAGNOSIS — Z9189 Other specified personal risk factors, not elsewhere classified: Secondary | ICD-10-CM

## 2019-05-30 DIAGNOSIS — Z20828 Contact with and (suspected) exposure to other viral communicable diseases: Secondary | ICD-10-CM | POA: Diagnosis not present

## 2019-05-30 DIAGNOSIS — F17210 Nicotine dependence, cigarettes, uncomplicated: Secondary | ICD-10-CM | POA: Diagnosis not present

## 2019-05-30 NOTE — ED Triage Notes (Signed)
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

## 2019-05-30 NOTE — Discharge Instructions (Signed)
If your Covid-19 test is positive, you will get a phone call from [facility] regarding your results. If your Covid-19 test is negative, you will NOT get a phone call from [facility] with your results. You may view your results on MyChart. If you do not have a MyChart account, sign up instructions are in your discharge papers. ° °

## 2019-06-01 LAB — NOVEL CORONAVIRUS, NAA (HOSP ORDER, SEND-OUT TO REF LAB; TAT 18-24 HRS): SARS-CoV-2, NAA: NOT DETECTED

## 2019-06-03 ENCOUNTER — Encounter (HOSPITAL_COMMUNITY): Payer: Self-pay

## 2019-06-04 NOTE — ED Provider Notes (Signed)
[facility]   [id number] 05/30/19 Arrival Time: 1884  ASSESSMENT & PLAN:  1. At increased risk of exposure to COVID-19 virus     COVID testing sent. To self-quarantine until results are available.  Follow-up Information    [name].   Specialty: Urgent Care Why: As needed. Contact information: [place] [phone]          Reviewed expectations re: course of current medical issues. Questions answered. Outlined signs and symptoms indicating need for more acute intervention. Patient verbalized understanding. After Visit Summary given.   SUBJECTIVE: History from: patient. Heather Casey is a 28 y.o. female who requests COVID-19 testing. Known COVID-19 contact: "They told Heather Casey someone that works with me tested positive for COVID.". Recent travel: none. Denies: runny nose, congestion, fever, cough, sore throat, difficulty breathing and headache.  ROS: As per HPI.   OBJECTIVE:  Vitals:   05/30/19 1902 05/30/19 1906  BP:  133/82  Pulse:  91  Resp:  18  Temp:  98.3 F (36.8 C)  TempSrc:  Oral  SpO2:  100%  Weight: 90.7 kg     General appearance: alert; no distress Eyes: PERRLA; EOMI; conjunctiva normal HENT: [place]; AT; nasal mucosa normal; oral mucosa normal Neck: supple  Lungs: clear to auscultation bilaterally; unlabored Heart: regular rate and rhythm Abdomen: soft, non-tender Extremities: no edema Skin: warm and dry Neurologic: normal gait Psychological: alert and cooperative; normal mood and affect  Labs:  Labs Reviewed  NOVEL CORONAVIRUS, NAA (HOSP ORDER, SEND-OUT TO REF LAB; TAT 18-24 HRS)       Past Medical History:  Diagnosis Date  . Medical history non-contributory    Social History   Socioeconomic History  . Marital status: Single    Spouse name: Not on file  . Number of children: Not on file  . Years of education: Not on file  . Highest education  level: Not on file  Occupational History  . Not on file  Social Needs  . Financial resource strain: Not on file  . Food insecurity    Worry: Not on file    Inability: Not on file  . Transportation needs    Medical: Not on file    Non-medical: Not on file  Tobacco Use  . Smoking status: Light Tobacco Smoker    Packs/day: 0.25  . Smokeless tobacco: Never Used  Substance and Sexual Activity  . Alcohol use: Yes    Comment: socially  . Drug use: No  . Sexual activity: Yes    Birth control/protection: None  Lifestyle  . Physical activity    Days per week: Not on file    Minutes per session: Not on file  . Stress: Not on file  Relationships  . Social Herbalist on phone: Not on file    Gets together: Not on file    Attends religious service: Not on file    Active member of club or organization: Not on file    Attends meetings of clubs or organizations: Not on file    Relationship status: Not on file  . Intimate partner violence    Fear of current or ex partner: Not on file    Emotionally abused: Not on file    Physically abused: Not on file    Forced sexual activity: Not on file  Other Topics Concern  . Not on file  Social History Narrative  . Not on  file   Family History  Problem Relation Age of Onset  . Hypertension Mother    Past Surgical History:  Procedure Laterality Date  . NO PAST SURGERIES       Mardella Layman, MD 06/04/19 [phone]

## 2019-08-09 ENCOUNTER — Other Ambulatory Visit: Payer: Self-pay

## 2019-08-09 DIAGNOSIS — Z20822 Contact with and (suspected) exposure to covid-19: Secondary | ICD-10-CM

## 2019-08-12 LAB — NOVEL CORONAVIRUS, NAA: SARS-CoV-2, NAA: DETECTED — AB

## 2019-08-28 ENCOUNTER — Ambulatory Visit: Payer: Self-pay

## 2019-08-28 NOTE — Telephone Encounter (Signed)
Message from Mathis Bud sent at 08/28/2019 10:42 AM EST  ret'd call to pt.  Reported she has had a headache since yesterday.  Rated HA at 7-8/10.  Voiced concern that has been having intermittent headaches that last 3-4 days.  Reported she had COVID positive test on 12/11.  Stated the symptoms of COVID were stuffy nose and infreq. Cough, and "some shortness of breath".  Stated she did not experience headache during her COVID illness.  Denied any vision change, speech difficulty, weakness in extremities, or N/V.  Stated has had intermittent dizziness. Unsure of her PCP.  Advised to go to UC for evaluation today.  Care Advice given per protocol.  Pt. Verb. Understanding and agreed with recommendations.   Reason for Disposition . [1] SEVERE headache (e.g., excruciating) AND [2] not improved after 2 hours of pain medicine    Reported having onset of HA 12/29; constant; rated 7-8/10;  Answer Assessment - Initial Assessment Questions 1. LOCATION: "Where does it hurt?"     Temporal and Front of head 2. ONSET: "When did the headache start?" (Minutes, hours or days)     12/29 3. PATTERN: "Does the pain come and go, or has it been constant since it started?"     Comes and goes , but lasts about 3-4 days  4. SEVERITY: "How bad is the pain?" and "What does it keep you from doing?"  (e.g., Scale 1-10; mild, moderate, or severe)   - MILD (1-3): doesn't interfere with normal activities    - MODERATE (4-7): interferes with normal activities or awakens from sleep    - SEVERE (8-10): excruciating pain, unable to do any normal activities        7-8/10 5. RECURRENT SYMPTOM: "Have you ever had headaches before?" If so, ask: "When was the last time?" and "What happened that time?"      Hasn't had in about 8 yrs.  6. CAUSE: "What do you think is causing the headache?"     Unknown 7. MIGRAINE: "Have you been diagnosed with migraine headaches?" If so, ask: "Is this headache similar?"     no 8. HEAD INJURY:  "Has there been any recent injury to the head?"     Denied  9. OTHER SYMPTOMS: "Do you have any other symptoms?" (fever, stiff neck, eye pain, sore throat, cold symptoms)     Denied vision changes, speech difficulty, or weakness of extremities; c/o intermittent dizziness; denied n/v  10. PREGNANCY: "Is there any chance you are pregnant?" "When was your last menstrual period?"       LMP 12/19; on BCP  Protocols used: HEADACHE-A-AH  Summary: covid/headaches    Patient was tested positive 12/11- patient states just recently she has been getting "awful" headaches. Patient states she would like to speak with nurse to see what else she should do.  Call back [phone]

## 2019-12-19 ENCOUNTER — Encounter (HOSPITAL_COMMUNITY): Payer: Self-pay

## 2019-12-19 ENCOUNTER — Ambulatory Visit (HOSPITAL_COMMUNITY)
Admission: EM | Admit: 2019-12-19 | Discharge: 2019-12-19 | Disposition: A | Discharge status: Home / Self Care | Payer: Self-pay | Attending: Family Medicine | Admitting: Family Medicine

## 2019-12-19 ENCOUNTER — Other Ambulatory Visit: Payer: Self-pay

## 2019-12-19 DIAGNOSIS — J029 Acute pharyngitis, unspecified: Secondary | ICD-10-CM | POA: Insufficient documentation

## 2019-12-19 DIAGNOSIS — R0982 Postnasal drip: Secondary | ICD-10-CM | POA: Insufficient documentation

## 2019-12-19 DIAGNOSIS — J3089 Other allergic rhinitis: Secondary | ICD-10-CM | POA: Insufficient documentation

## 2019-12-19 LAB — POCT RAPID STREP A: Streptococcus, Group A Screen (Direct): NEGATIVE

## 2019-12-19 MED ORDER — CETIRIZINE HCL 10 MG PO TABS: 10.0000 mg | ORAL_TABLET | Freq: Every day | ORAL | 0 refills | Status: DC

## 2019-12-19 NOTE — Discharge Instructions (Addendum)
You are experiencing allergies this season.  I have sent in a prescription for you for zyrtec. Take one tablet daily for at least the next 2 weeks, may continue through the season if this is helping you.  Your rapid strep test is negative.  A throat culture is pending; we will call you if it is positive requiring treatment.

## 2019-12-19 NOTE — ED Provider Notes (Signed)
[facility]    CSN: [id number] Arrival date & time: 12/19/19  1426      History   Chief Complaint Chief Complaint  Patient presents with  . Sore Throat    HPI Heather Casey is a 29 y.o. female.   Patient reports with sore throat since last night.  Patient has not made any attempts to treat at home other than drinking warm liquids.  Denies sick contacts.  Patient does report that she works for the [employer] and has been working a lot more overtime lately.  Patient denies history of  strep throat, seasonal allergies.  Per chart review, patient has no significant medical history other than she is a smoker.  Denies headache, shortness of breath, cough, nausea, vomiting, diarrhea, rash, fever, other symptoms.   ROS per HPI    The history is provided by the patient.    Past Medical History:  Diagnosis Date  . Medical history non-contributory     There are no problems to display for this patient.   Past Surgical History:  Procedure Laterality Date  . NO PAST SURGERIES      OB History    Gravida  3   Para  1   Term  1   Preterm      AB  2   Living  1     SAB      TAB  2   Ectopic      Multiple      Live Births  1            Home Medications    Prior to Admission medications   Medication Sig Start Date End Date Taking? Authorizing Provider  cetirizine (ZYRTEC ALLERGY) 10 MG tablet Take 1 tablet (10 mg total) by mouth daily. 12/19/19   Moshe Cipro, NP  Etonogestrel [facility]) Inject 1 Intra Uterine Device into the skin once.     [provider]  metroNIDAZOLE (FLAGYL) 500 MG tablet Take 1 tablet (500 mg total) by mouth 2 (two) times daily. 07/26/18   Elisha Ponder, PA-C    Family History Family History  Problem Relation Age of Onset  . Hypertension Mother     Social History Social History   Tobacco Use  . Smoking status: Light Tobacco Smoker    Packs/day: 0.25  . Smokeless tobacco: Never Used    Substance Use Topics  . Alcohol use: Yes    Comment: socially  . Drug use: No     Allergies   Bactrim [sulfamethoxazole-trimethoprim] and Penicillins   Review of Systems Review of Systems   Physical Exam Triage Vital Signs ED Triage Vitals  Enc Vitals Group     BP 12/19/19 1509 134/89     Pulse Rate 12/19/19 1509 85     Resp 12/19/19 1509 18     Temp 12/19/19 1509 98 F (36.7 C)     Temp Source 12/19/19 1509 Oral     SpO2 12/19/19 1509 100 %     Weight --      Height --      Head Circumference --      Peak Flow --      Pain Score 12/19/19 1510 7     Pain Loc --      Pain Edu? --      Excl. in GC? --    No data found.  Updated Vital Signs BP 134/89 (BP Location: Left Arm)   Pulse 85  Temp 98 F (36.7 C) (Oral)   Resp 18   LMP 12/11/2019   SpO2 100%   Visual Acuity Right Eye Distance:   Left Eye Distance:   Bilateral Distance:    Right Eye Near:   Left Eye Near:    Bilateral Near:     Physical Exam Vitals and nursing note reviewed.  Constitutional:      General: She is not in acute distress.    Appearance: She is well-developed. She is obese. She is not ill-appearing.  HENT:     Head: Normocephalic and atraumatic.     Right Ear: Tympanic membrane normal.     Left Ear: Tympanic membrane normal.     Mouth/Throat:     Mouth: Mucous membranes are moist.     Pharynx: Oropharynx is clear. Posterior oropharyngeal erythema present.     Tonsils: No tonsillar exudate or tonsillar abscesses.  Eyes:     Conjunctiva/sclera: Conjunctivae normal.  Cardiovascular:     Rate and Rhythm: Normal rate and regular rhythm.     Heart sounds: Normal heart sounds. No murmur.  Pulmonary:     Effort: Pulmonary effort is normal. No respiratory distress.     Breath sounds: Normal breath sounds. No stridor. No wheezing, rhonchi or rales.  Chest:     Chest wall: No tenderness.  Abdominal:     Palpations: Abdomen is soft.     Tenderness: There is no abdominal  tenderness.  Musculoskeletal:     Cervical back: Neck supple.  Skin:    General: Skin is warm and dry.     Capillary Refill: Capillary refill takes less than 2 seconds.  Neurological:     General: No focal deficit present.     Mental Status: She is alert and oriented to person, place, and time.  Psychiatric:        Mood and Affect: Mood normal.        Behavior: Behavior normal.      UC Treatments / Results  Labs (all labs ordered are listed, but only abnormal results are displayed) Labs Reviewed  CULTURE, GROUP A STREP [facility])  POCT RAPID STREP A    EKG   Radiology No results found.  Procedures Procedures (including critical care time)  Medications Ordered in UC Medications - No data to display  Initial Impression / Assessment and Plan / UC Course  I have reviewed the triage vital signs and the nursing notes.  Pertinent labs & imaging results that were available during my care of the patient were reviewed by me and considered in my medical decision making (see chart for details).     Pharyngitis: Presents with pharyngitis since last night. Drinking warm liquids with temporary relief. On exam, CTA bilaterally, oropharynx erythematous but no edema, no exudate noted. Postnasal drip noted. Rapid strep in office negative. Will culture as well. Pt informed that her results will be available via My Chart.  If her results are positive, we will contact her if treatment is needed.  Likely allergic rhinitis, prescribed Zyrtec 10 mg tablets daily.  Patient instructed to follow-up here with primary care as needed.  Patient instructed to follow-up the ER for trouble swallowing, trouble breathing, other concerning symptoms. Final Clinical Impressions(s) / UC Diagnoses   Final diagnoses:  Pharyngitis, unspecified etiology  Allergic rhinitis due to other allergic trigger, unspecified seasonality  Postnasal drip     Discharge Instructions     You are experiencing allergies this  season.  I have sent in  a prescription for you for zyrtec. Take one tablet daily for at least the next 2 weeks, may continue through the season if this is helping you.  Your rapid strep test is negative.  A throat culture is pending; we will call you if it is positive requiring treatment.       ED Prescriptions    Medication Sig Dispense Auth. Provider   cetirizine (ZYRTEC ALLERGY) 10 MG tablet Take 1 tablet (10 mg total) by mouth daily. 30 tablet Moshe Cipro, NP     PDMP not reviewed this encounter.   Moshe Cipro, NP 12/20/19 1449

## 2019-12-19 NOTE — ED Triage Notes (Signed)
Pt presents with sore throat since last night. 

## 2019-12-22 LAB — CULTURE, GROUP A STREP (THRC)

## 2021-03-10 ENCOUNTER — Inpatient Hospital Stay (HOSPITAL_COMMUNITY)
Admission: AD | Admit: 2021-03-10 | Discharge: 2021-03-10 | Disposition: A | Discharge status: Home / Self Care | Payer: Medicaid Other | Attending: Obstetrics & Gynecology | Admitting: Obstetrics & Gynecology

## 2021-03-10 ENCOUNTER — Other Ambulatory Visit: Payer: Self-pay

## 2021-03-10 DIAGNOSIS — R1030 Lower abdominal pain, unspecified: Secondary | ICD-10-CM | POA: Insufficient documentation

## 2021-03-10 DIAGNOSIS — R519 Headache, unspecified: Secondary | ICD-10-CM | POA: Insufficient documentation

## 2021-03-10 DIAGNOSIS — Z3200 Encounter for pregnancy test, result unknown: Secondary | ICD-10-CM | POA: Insufficient documentation

## 2021-03-10 DIAGNOSIS — Z3202 Encounter for pregnancy test, result negative: Secondary | ICD-10-CM

## 2021-03-10 LAB — HCG, QUANTITATIVE, PREGNANCY: hCG, Beta Chain, Quant, S: <1 m[IU]/mL (ref <5)

## 2021-03-10 LAB — POCT PREGNANCY, URINE: Preg Test, Ur: NEGATIVE

## 2021-03-10 NOTE — MAU Note (Signed)
Presents c/o abdominal cramping, no VB.  Reports +HPT.  LMP 12/16/2020.

## 2021-03-10 NOTE — MAU Provider Note (Signed)
Event Date/Time   First Provider Initiated Contact with Patient 03/10/21 1347      S Ms. Heather Casey is a 30 y.o. [id number] patient who presents to MAU today with complaint of possible pregnancy and cramping. Patient reports Sunday evening she started having lower abdominal cramping and a headache. Took a pregnancy test last night out of curiosity and it initially came back negative, however she looked at it later and it was positive. She reports that she has a Nexplanon that was just replaced back in March. She denies vaginal bleeding or discharge.   O BP 139/85 (BP Location: Right Arm)   Pulse 90   Temp 98.2 F (36.8 C) (Oral)   Resp 20   Ht 5\' 4"  (1.626 m)   Wt 97.3 kg   SpO2 99%   BMI 36.84 kg/m  Physical Exam Cardiovascular:     Rate and Rhythm: Normal rate.  Pulmonary:     Effort: Pulmonary effort is normal.  Abdominal:     Palpations: Abdomen is soft.  Musculoskeletal:        General: Normal range of motion.  Skin:    General: Skin is warm.  Neurological:     General: No focal deficit present.     Mental Status: She is alert.  Psychiatric:        Mood and Affect: Mood normal.        Behavior: Behavior normal.        Thought Content: Thought content normal.   UPT: negative HCG: < 1  A Medical screening exam complete Possible Pregnancy, not pregnant Discussed reading pregnancy tests as directed on the box. Do not read tests after results due to risk of false positive.     P Discharge from MAU in stable condition Follow up at [facility] as needed Warning signs for worsening condition that would warrant emergency follow-up discussed Patient may return to MAU as needed for [facility] emergencies     [facility], MSN, CNM 03/10/2021 3:36 PM

## 2021-03-30 ENCOUNTER — Other Ambulatory Visit: Payer: Self-pay

## 2021-03-30 ENCOUNTER — Emergency Department (HOSPITAL_BASED_OUTPATIENT_CLINIC_OR_DEPARTMENT_OTHER)
Admission: EM | Admit: 2021-03-30 | Discharge: 2021-03-30 | Disposition: A | Discharge status: Home / Self Care | Payer: Medicaid Other | Attending: Emergency Medicine | Admitting: Emergency Medicine

## 2021-03-30 DIAGNOSIS — H00015 Hordeolum externum left lower eyelid: Secondary | ICD-10-CM | POA: Insufficient documentation

## 2021-03-30 DIAGNOSIS — F17210 Nicotine dependence, cigarettes, uncomplicated: Secondary | ICD-10-CM | POA: Insufficient documentation

## 2021-03-30 MED ORDER — ERYTHROMYCIN 5 MG/GM OP OINT
TOPICAL_OINTMENT | Freq: Four times a day (QID) | OPHTHALMIC | Status: DC
  Filled 2021-03-30: qty 3.5

## 2021-03-30 NOTE — ED Provider Notes (Signed)
[facility] EMERGENCY DEPT Provider Note   CSN: [id number] Arrival date & time: 03/30/21  2057     History Chief Complaint  Patient presents with   Eye Problem    Heather Casey is a 30 y.o. female.  The history is provided by the patient.  Eye Problem Location:  Left eye Severity:  Mild Timing:  Constant Progression:  Improving Chronicity:  New Context comment:  Swelling to left lower eye lid Relieved by: warm compress. Worsened by:  Nothing Associated symptoms: no blurred vision, no crusting, no decreased vision, no discharge, no double vision, no inflammation, no itching, no nausea, no scotomas, no swelling and no tearing       Past Medical History:  Diagnosis Date   Medical history non-contributory     There are no problems to display for this patient.   Past Surgical History:  Procedure Laterality Date   NO PAST SURGERIES       OB History     Gravida  3   Para  1   Term  1   Preterm      AB  2   Living  1      SAB      IAB  2   Ectopic      Multiple      Live Births  1           Family History  Problem Relation Age of Onset   Hypertension Mother     Social History   Tobacco Use   Smoking status: Light Smoker    Packs/day: 0.25    Types: Cigarettes   Smokeless tobacco: Never  Vaping Use   Vaping Use: Never used  Substance Use Topics   Alcohol use: Yes    Comment: socially   Drug use: No    Home Medications Prior to Admission medications   Medication Sig Start Date End Date Taking? Authorizing Provider  cetirizine (ZYRTEC ALLERGY) 10 MG tablet Take 1 tablet (10 mg total) by mouth daily. 12/19/19   Moshe Cipro, NP  Etonogestrel [facility]) Inject 1 Intra Uterine Device into the skin once.     [provider]    Allergies    Bactrim [sulfamethoxazole-trimethoprim] and Penicillins  Review of Systems   Review of Systems  Eyes:  Negative for blurred vision, double vision, discharge  and itching.  Gastrointestinal:  Negative for nausea.   Physical Exam Updated Vital Signs There were no vitals taken for this visit.  Physical Exam Eyes:     General:        Right eye: No discharge.        Left eye: No discharge.     Extraocular Movements: Extraocular movements intact.     Pupils: Pupils are equal, round, and reactive to light.     Comments: Stye to the bottom left eyelid with no surrounding redness, drainage, erythema, swelling    ED Results / Procedures / Treatments   Labs (all labs ordered are listed, but only abnormal results are displayed) Labs Reviewed - No data to display  EKG None  Radiology No results found.  Procedures Procedures   Medications Ordered in ED Medications  erythromycin ophthalmic ointment (has no administration in time range)    ED Course  I have reviewed the triage vital signs and the nursing notes.  Pertinent labs & imaging results that were available during my care of the patient were reviewed by me and considered in my medical decision  making (see chart for details).    MDM Rules/Calculators/A&P                           Heather Casey is here with left eye issue.  Overall she has a hordeolum to the lower left eyelid.  Uncomplicated appearing.  Recommend warm compresses.  Will prescribe antibiotic ointment.  Given return precautions discharged in ED in good condition.  This chart was dictated using voice recognition software.  Despite best efforts to proofread,  errors can occur which can change the documentation meaning.  Final Clinical Impression(s) / ED Diagnoses Final diagnoses:  Hordeolum externum of left lower eyelid    Rx / DC Orders ED Discharge Orders     None        Virgina Norfolk, DO 03/30/21 2105

## 2021-03-30 NOTE — Discharge Instructions (Addendum)
Recommend warm compress to your left eye several times a day.  Take antibiotic ointment every 6 hours until clear

## 2021-03-30 NOTE — ED Triage Notes (Signed)
Patient c/o swelling of left eye.

## 2022-06-07 ENCOUNTER — Other Ambulatory Visit: Payer: Self-pay

## 2022-06-07 ENCOUNTER — Emergency Department (HOSPITAL_BASED_OUTPATIENT_CLINIC_OR_DEPARTMENT_OTHER)
Admission: EM | Admit: 2022-06-07 | Discharge: 2022-06-07 | Disposition: A | Discharge status: Home / Self Care | Payer: Federal, State, Local not specified - PPO | Attending: Emergency Medicine | Admitting: Emergency Medicine

## 2022-06-07 ENCOUNTER — Emergency Department (HOSPITAL_BASED_OUTPATIENT_CLINIC_OR_DEPARTMENT_OTHER): Payer: Federal, State, Local not specified - PPO

## 2022-06-07 ENCOUNTER — Encounter (HOSPITAL_BASED_OUTPATIENT_CLINIC_OR_DEPARTMENT_OTHER): Payer: Self-pay | Admitting: Emergency Medicine

## 2022-06-07 DIAGNOSIS — R519 Headache, unspecified: Secondary | ICD-10-CM | POA: Diagnosis present

## 2022-06-07 DIAGNOSIS — Z20822 Contact with and (suspected) exposure to covid-19: Secondary | ICD-10-CM | POA: Diagnosis not present

## 2022-06-07 LAB — RESP PANEL BY RT-PCR (FLU A&B, COVID) ARPGX2
Influenza A by PCR: NEGATIVE
Influenza B by PCR: NEGATIVE
SARS Coronavirus 2 by RT PCR: NEGATIVE

## 2022-06-07 LAB — BASIC METABOLIC PANEL
Anion gap: 6 (ref 5–15)
BUN: 12 mg/dL (ref 6–20)
CO2: 28 mmol/L (ref 22–32)
Calcium: 9.5 mg/dL (ref 8.9–10.3)
Chloride: 102 mmol/L (ref 98–111)
Creatinine, Ser: 0.7 mg/dL (ref 0.44–1.00)
GFR, Estimated: >60 mL/min (ref >60)
Glucose, Bld: 85 mg/dL (ref 70–99)
Potassium: 4.2 mmol/L (ref 3.5–5.1)
Sodium: 136 mmol/L (ref 135–145)

## 2022-06-07 LAB — CBC WITH DIFFERENTIAL/PLATELET
Abs Immature Granulocytes: 0.02 10*3/uL (ref 0.00–0.07)
Basophils Absolute: 0 10*3/uL (ref 0.0–0.1)
Basophils Relative: 0 %
Eosinophils Absolute: 0.1 10*3/uL (ref 0.0–0.5)
Eosinophils Relative: 2 %
HCT: 40 % (ref 36.0–46.0)
Hemoglobin: 13.5 g/dL (ref 12.0–15.0)
Immature Granulocytes: 0 %
Lymphocytes Relative: 43 %
Lymphs Abs: 2.9 10*3/uL (ref 0.7–4.0)
MCH: 30.1 pg (ref 26.0–34.0)
MCHC: 33.8 g/dL (ref 30.0–36.0)
MCV: 89.3 fL (ref 80.0–100.0)
Monocytes Absolute: 0.6 10*3/uL (ref 0.1–1.0)
Monocytes Relative: 9 %
Neutro Abs: 3.1 10*3/uL (ref 1.7–7.7)
Neutrophils Relative %: 46 %
Platelets: 435 10*3/uL — ABNORMAL HIGH (ref 150–400)
RBC: 4.48 MIL/uL (ref 3.87–5.11)
RDW: 12.5 % (ref 11.5–15.5)
WBC: 6.8 10*3/uL (ref 4.0–10.5)
nRBC: 0 % (ref 0.0–0.2)

## 2022-06-07 LAB — HCG, SERUM, QUALITATIVE: Preg, Serum: NEGATIVE

## 2022-06-07 MED ORDER — SODIUM CHLORIDE 0.9 % IV BOLUS
1000.0000 mL | Freq: Once | INTRAVENOUS | Status: AC
  Administered 2022-06-07: 1000 mL via INTRAVENOUS

## 2022-06-07 MED ORDER — IOHEXOL 350 MG/ML SOLN
100.0000 mL | Freq: Once | INTRAVENOUS | Status: AC | PRN
  Administered 2022-06-07: 75 mL via INTRAVENOUS

## 2022-06-07 MED ORDER — METOCLOPRAMIDE HCL 5 MG/ML IJ SOLN
10.0000 mg | Freq: Once | INTRAMUSCULAR | Status: AC
  Administered 2022-06-07: 10 mg via INTRAVENOUS
  Filled 2022-06-07: qty 2

## 2022-06-07 NOTE — Discharge Instructions (Signed)
You are seen today in the emergency department for headache.  The CTA head and neck do not show any signs of bleeding.,  Intracranial mass, cancer.  Your laboratory work-up is also reassuring.  Please call and schedule an appointment with neurology for follow-up given persistent headaches.  If you have weakness on one side of your body, vision changes, sudden onset of a thunderclap headache or new symptoms you should return back to the ED for additional evaluation.

## 2022-06-07 NOTE — ED Triage Notes (Signed)
Headache x 11 days, now radiates to left neck. And ear pain Relieved some with tylenol Last tylenol at 15:30  Similar to issues she had in the past. MRI many years ago. Concerned with family history of brain tumor.

## 2022-06-07 NOTE — ED Provider Notes (Signed)
[facility] EMERGENCY DEPT Provider Note   CSN: [id number] Arrival date & time: 06/07/22  1736     History  Chief Complaint  Patient presents with   Headache    Heather Casey is a 31 y.o. female.   Headache    Patient presents due to headache x11 days.  It is constant throughout the day, states it might be slightly worse at night.  It does improve when she takes Tylenol.  The headache is to the posterior of her head, not lateralized.  Denies any vision changes, the pain started moving to her left neck a few days ago.  States she has a fan member who died of an aneurysm at age 33, also history of a different family member who had an intracranial tumor.  Patient had a normal MRI 12 years ago.  Has not followed up with neurology since then.   Denies any history of PKD.  Does not take medicine hyperlipidemia, hypertension.  Patient does smoke cigarettes daily.  Home Medications Prior to Admission medications   Medication Sig Start Date End Date Taking? Authorizing Provider  cetirizine (ZYRTEC ALLERGY) 10 MG tablet Take 1 tablet (10 mg total) by mouth daily. 12/19/19   Moshe Cipro, NP  Etonogestrel [facility]) Inject 1 Intra Uterine Device into the skin once.     [provider]      Allergies    Bactrim [sulfamethoxazole-trimethoprim] and Penicillins    Review of Systems   Review of Systems  Neurological:  Positive for headaches.    Physical Exam Updated Vital Signs BP 110/69   Pulse 89   Temp 98.2 F (36.8 C) (Oral)   Resp 17   LMP 05/12/2022 (Approximate)   SpO2 100%  Physical Exam Vitals and nursing note reviewed. Exam conducted with a chaperone present.  Constitutional:      Appearance: Normal appearance.  HENT:     Head: Normocephalic and atraumatic.  Eyes:     General: No scleral icterus.       Right eye: No discharge.        Left eye: No discharge.     Extraocular Movements: Extraocular movements intact.     Pupils:  Pupils are equal, round, and reactive to light.  Neck:     Comments: No carotid bruits.  No cervical spine tenderness, full ROM and no nuchal rigidity. Cardiovascular:     Rate and Rhythm: Normal rate and regular rhythm.     Pulses: Normal pulses.     Heart sounds: Normal heart sounds. No murmur heard.    No friction rub. No gallop.  Pulmonary:     Effort: Pulmonary effort is normal. No respiratory distress.     Breath sounds: Normal breath sounds.  Abdominal:     General: Abdomen is flat. Bowel sounds are normal. There is no distension.     Palpations: Abdomen is soft.     Tenderness: There is no abdominal tenderness.  Musculoskeletal:     Cervical back: Normal range of motion.  Skin:    General: Skin is warm and dry.     Coloration: Skin is not jaundiced.  Neurological:     Mental Status: She is alert. Mental status is at baseline.     GCS: GCS eye subscore is 4. GCS verbal subscore is 5. GCS motor subscore is 6.     Cranial Nerves: No cranial nerve deficit.     Coordination: Coordination normal.     Comments: Cranial nerves II through  XII are grossly intact.  Visual fields are intact.  No pronator drift, normal finger-nose.  Upper lip and lower extremity strength 5/5 against resistance.  Sensation to light touch is grossly intact.  No dysarthria, oriented x3     ED Results / Procedures / Treatments   Labs (all labs ordered are listed, but only abnormal results are displayed) Labs Reviewed  CBC WITH DIFFERENTIAL/PLATELET - Abnormal; Notable for the following components:      Result Value   Platelets 435 (*)    All other components within normal limits  RESP PANEL BY RT-PCR (FLU A&B, COVID) ARPGX2  BASIC METABOLIC PANEL  HCG, SERUM, QUALITATIVE    EKG None  Radiology CT ANGIO HEAD NECK W WO CM  Result Date: 06/07/2022 CLINICAL DATA:  Headache 11 days.  Rule out cerebral vasospasm. EXAM: CT ANGIOGRAPHY HEAD AND NECK TECHNIQUE: Multidetector CT imaging of the head and  neck was performed using the standard protocol during bolus administration of intravenous contrast. Multiplanar CT image reconstructions and MIPs were obtained to evaluate the vascular anatomy. Carotid stenosis measurements (when applicable) are obtained utilizing NASCET criteria, using the distal internal carotid diameter as the denominator. RADIATION DOSE REDUCTION: This exam was performed according to the departmental dose-optimization program which includes automated exposure control, adjustment of the mA and/or kV according to patient size and/or use of iterative reconstruction technique. CONTRAST:  74mL OMNIPAQUE IOHEXOL 350 MG/ML SOLN COMPARISON:  None Available. FINDINGS: CT HEAD FINDINGS Brain: No evidence of acute infarction, hemorrhage, hydrocephalus, extra-axial collection or mass lesion/mass effect. Vascular: Negative for hyperdense vessel Skull: Negative Sinuses/Orbits: Negative Other: None Review of the MIP images confirms the above findings CTA NECK FINDINGS Aortic arch: Normal aortic arch. Proximal great vessels widely patent. Right carotid system: Normal right carotid. Negative for atherosclerotic disease or stenosis Left carotid system: Normal left carotid. Negative for atherosclerotic disease or stenosis Vertebral arteries: Normal vertebral arteries bilaterally Skeleton: Negative Other neck: Negative for mass or adenopathy in the neck. Upper chest: Lung apices clear bilaterally. Review of the MIP images confirms the above findings CTA HEAD FINDINGS Anterior circulation: Internal carotid artery widely patent through the skull base and cavernous segment. Anterior and middle cerebral arteries normal bilaterally. No stenosis or vascular malformation Posterior circulation: Both vertebral arteries patent to the basilar without stenosis. Small PICA patent bilaterally. Basilar patent. Posterior cerebral arteries patent bilaterally. No stenosis or vascular malformation Venous sinuses: No venous  thrombosis. 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 Anatomic variants: None Review of the MIP images confirms the above findings IMPRESSION: Normal CT head. Normal CTA head and neck. Electronically Signed   By: Franchot Gallo M.D.   On: 06/07/2022 19:56    Procedures Procedures    Medications Ordered in ED Medications  sodium chloride 0.9 % bolus 1,000 mL (0 mLs Intravenous Stopped 06/07/22 2010)  metoCLOPramide (REGLAN) injection 10 mg (10 mg Intravenous Given 06/07/22 1851)  iohexol (OMNIPAQUE) 350 MG/ML injection 100 mL (75 mLs Intravenous Contrast Given 06/07/22 1946)    ED Course/ Medical Decision Making/ A&P                           Medical Decision Making Amount and/or Complexity of Data Reviewed Labs: ordered. Radiology: ordered.  Risk Prescription drug management.   Patient presents due to headache.  Differential includes but not limited to [facility], intracranial tumor, CVA/TIA, aneurysm.  Afebrile, no nuchal rigidity, does not appear consistent with meningitis.  No visual symptoms,  not consistent with optic neuritis.  There is no temporal pain, I do not think this is GCA or temporal arteritis.  I reviewed external medical records, patient does not routinely see a PCP.  I ordered, reviewed and interpreted laboratory work-up. CBC without cytosis or anemia.  Platelet count is mildly elevated at 435. COVID and flu negative. Patient is not pregnant. BMP is without gross electrolyte drinks or AKI.  I ordered, reviewed and interpreted imaging study.  CTA head and neck shows no acute process.  Reassuringly no aneurysm or intracranial mass.  Agree with radiologist interpretation.  I reviewed patient's medication.  I ordered 10 mg of Reglan and 1 L fluid bolus.  I reevaluated the patient who feels improved.  Given reassuring CTA head and neck I do not think patient needs admission.  No signs of aneurysm, vertebral artery dissection, carotid dissection.  Her neuro  exam is very reassuring without any lateralized weakness and no focal deficits.  Patient is stable for outpatient follow-up, neurology referral was also provided.  Return precautions discussed, discharged stable condition.         Final Clinical Impression(s) / ED Diagnoses Final diagnoses:  Nonintractable headache, unspecified chronicity pattern, unspecified headache type    Rx / DC Orders ED Discharge Orders     None         Theron Arista, PA-C 06/07/22 2254    Benjiman Core, MD 06/07/22 2348

## 2022-06-13 ENCOUNTER — Ambulatory Visit: Payer: Federal, State, Local not specified - PPO | Admitting: Psychiatry

## 2022-06-13 ENCOUNTER — Encounter: Payer: Self-pay | Admitting: Psychiatry

## 2022-06-13 NOTE — Progress Notes (Deleted)
Referring:  No referring provider defined for this encounter.  PCP: Patient, No Pcp Per  Neurology was asked to evaluate Heather Casey, a 31 year old female for a chief complaint of headaches.  Our recommendations of care will be communicated by shared medical record.    CC:  headaches  History provided from ***  HPI:  Medical co-morbidities: ***  The patient presents for evaluation of headaches which began***  She presented to the ED 06/07/22 where CTA head/neck was unremarkable.  Headache History: Onset: Triggers: Aura: Location: Quality/Description: Associated Symptoms:  Photophobia:  Phonophobia:  Nausea: Vomiting: Allodynia: Other symptoms: Worse with activity?: Duration of headaches:  Headache days per month: *** Migraine days per month: *** Headache free days per month: ***  Current Treatment: Abortive ***  Preventative ***  Prior Therapies                                 ***   LABS: ***  IMAGING:  CTH/CTA head/neck 06/07/22: unremarkable  ***Imaging independently reviewed on June 13, 2022   Current Outpatient Medications on File Prior to Visit  Medication Sig Dispense Refill   cetirizine (ZYRTEC ALLERGY) 10 MG tablet Take 1 tablet (10 mg total) by mouth daily. 30 tablet 0   Etonogestrel (NEXPLANON [place]) Inject 1 Intra Uterine Device into the skin once.      No current facility-administered medications on file prior to visit.     Allergies: Allergies  Allergen Reactions   Bactrim [Sulfamethoxazole-Trimethoprim] Itching and Rash   Penicillins Rash and Other (See Comments)    Has patient had a PCN reaction causing immediate rash, facial/tongue/throat swelling, SOB or lightheadedness with hypotension: {Yes/No:30480221}No Has patient had a PCN reaction causing severe rash involving mucus membranes or skin necrosis: {Yes/No:30480221}No Has patient had a PCN reaction that required hospitalization {Yes/No:30480221}No Has patient had a PCN  reaction occurring within the last 10 years: {Yes/No:30480221}No If all of the above answers are "NO", then may proceed with Cephalosporin     Family History: Migraine or other headaches in the family:  *** Aneurysms in a first degree relative:  *** Brain tumors in the family:  *** Other neurological illness in the family:   ***  Past Medical History: Past Medical History:  Diagnosis Date   Medical history non-contributory     Past Surgical History Past Surgical History:  Procedure Laterality Date   NO PAST SURGERIES      Social History: Social History   Tobacco Use   Smoking status: Light Smoker    Packs/day: 0.25    Types: Cigarettes   Smokeless tobacco: Never  Vaping Use   Vaping Use: Never used  Substance Use Topics   Alcohol use: Yes    Comment: socially   Drug use: No   ***  ROS: Negative for fevers, chills. Positive for***. All other systems reviewed and negative unless stated otherwise in HPI.   Physical Exam:   Vital Signs: LMP 05/12/2022 (Approximate)  GENERAL: well appearing,in no acute distress,alert SKIN:  Color, texture, turgor normal. No rashes or lesions HEAD:  Normocephalic/atraumatic. CV:  RRR RESP: Normal respiratory effort MSK: no tenderness to palpation over occiput, neck, or shoulders  NEUROLOGICAL: Mental Status: Alert, oriented to person, place and time,Follows commands Cranial Nerves: PERRL, visual fields intact to confrontation, extraocular movements intact, facial sensation intact, no facial droop or ptosis, hearing grossly intact, no dysarthria, palate elevate symmetrically, tongue protrudes midline, shoulder  shrug intact and symmetric Motor: muscle strength 5/5 both upper and lower extremities,no drift, normal tone Reflexes: 2+ throughout Sensation: intact to light touch all 4 extremities Coordination: Finger-to- nose-finger intact bilaterally Gait: normal-based   IMPRESSION: ***  PLAN: ***   I spent a total of ***  minutes chart reviewing and counseling the patient. Headache education was done. Discussed treatment options including preventive and acute medications, natural supplements, and physical therapy. Discussed medication overuse headache and to limit use of acute treatments to no more than 2 days/week or 10 days/month. Discussed medication side effects, adverse reactions and drug interactions. Written educational materials and patient instructions outlining all of the above were given.  Follow-up: ***   Genia Harold, MD 06/13/2022   8:51 AM

## 2022-06-20 ENCOUNTER — Ambulatory Visit: Payer: Federal, State, Local not specified - PPO | Admitting: Neurology

## 2022-06-20 ENCOUNTER — Encounter: Payer: Self-pay | Admitting: Neurology

## 2022-06-20 ENCOUNTER — Telehealth: Payer: Self-pay

## 2022-06-20 VITALS — BP 125/82 | HR 75 | Ht 65.0 in | Wt 214.2 lb

## 2022-06-20 DIAGNOSIS — G43709 Chronic migraine without aura, not intractable, without status migrainosus: Secondary | ICD-10-CM

## 2022-06-20 DIAGNOSIS — R5383 Other fatigue: Secondary | ICD-10-CM | POA: Diagnosis not present

## 2022-06-20 DIAGNOSIS — Z0289 Encounter for other administrative examinations: Secondary | ICD-10-CM

## 2022-06-20 MED ORDER — TOPIRAMATE 50 MG PO TABS: 50.0000 mg | ORAL_TABLET | Freq: Every day | ORAL | 11 refills | Status: DC

## 2022-06-20 MED ORDER — ONDANSETRON 4 MG PO TBDP: 4.0000 mg | ORAL_TABLET | Freq: Three times a day (TID) | ORAL | 3 refills | Status: DC | PRN

## 2022-06-20 MED ORDER — RIZATRIPTAN BENZOATE 10 MG PO TBDP: 10.0000 mg | ORAL_TABLET | ORAL | 11 refills | Status: AC | PRN

## 2022-06-20 NOTE — Telephone Encounter (Signed)
FMLA forms received, pt was just seen today. Are you willing to complete FMLA? If so, for how long?  Next FU with Hedwig Morton 10/18/22

## 2022-06-20 NOTE — Progress Notes (Signed)
[facility]    Provider:  Dr Lucia Gaskins Requesting Provider: No ref. provider found Primary Care Provider:  Patient, No Pcp Per  CC:  Headache for 11 days   HPI:  Heather Casey is a 31 y.o. female here as requested by No ref. provider found for migraines with recent migraine for 11 days.  Past medical history includes migraines otherwise noncontributory. She went to the ED 11-12 years ago for a migraine. She recently had a migraine 11 days straight. She always have migraines before her cycle. It can last usually 24-72 hours. She takes excedrin or ibuprofen. Sister had migraines. Radiate to the neck. Pulsating/pounding/throbbing. Nausea, Light and sound sensitivity. She can vomit. Menstruation is a trigger. OTC meds can help. She has on average 15 migraine days a month but she can go weeks without a headache but recently she had headaches 11 days. No other focal neurologic deficits, associated symptoms, inciting events or modifiable factors.  Reviewed notes, labs and imaging from outside physicians, which showed:  From a thorough review of records, medications tried that can be used in migraine management include: Tylenol, Decadron injections, Benadryl, ibuprofen, meclizine, Robaxin, naproxen,  CTA H&N 05/29/2022: CLINICAL DATA:  Headache 11 days.  Rule out cerebral vasospasm.   EXAM: CT ANGIOGRAPHY HEAD AND NECK (personally reviewed images added 10 minutes to overall appointment)   TECHNIQUE: Multidetector CT imaging of the head and neck was performed using the standard protocol during bolus administration of intravenous contrast. Multiplanar CT image reconstructions and MIPs were obtained to evaluate the vascular anatomy. Carotid stenosis measurements (when applicable) are obtained utilizing NASCET criteria, using the distal internal carotid diameter as the denominator.   RADIATION DOSE REDUCTION: This exam was performed according to the departmental dose-optimization  program which includes automated exposure control, adjustment of the mA and/or kV according to patient size and/or use of iterative reconstruction technique.   CONTRAST:  80mL OMNIPAQUE IOHEXOL 350 MG/ML SOLN   COMPARISON:  None Available.   FINDINGS: CT HEAD FINDINGS   Brain: No evidence of acute infarction, hemorrhage, hydrocephalus, extra-axial collection or mass lesion/mass effect.   Vascular: Negative for hyperdense vessel   Skull: Negative   Sinuses/Orbits: Negative   Other: None   Review of the MIP images confirms the above findings   CTA NECK FINDINGS   Aortic arch: Normal aortic arch. Proximal great vessels widely patent.   Right carotid system: Normal right carotid. Negative for atherosclerotic disease or stenosis   Left carotid system: Normal left carotid. Negative for atherosclerotic disease or stenosis   Vertebral arteries: Normal vertebral arteries bilaterally   Skeleton: Negative   Other neck: Negative for mass or adenopathy in the neck.   Upper chest: Lung apices clear bilaterally.   Review of the MIP images confirms the above findings   CTA HEAD FINDINGS   Anterior circulation: Internal carotid artery widely patent through the skull base and cavernous segment. Anterior and middle cerebral arteries normal bilaterally. No stenosis or vascular malformation   Posterior circulation: Both vertebral arteries patent to the basilar without stenosis. Small PICA patent bilaterally. Basilar patent. Posterior cerebral arteries patent bilaterally. No stenosis or vascular malformation   Venous sinuses: No venous thrombosis. 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   Anatomic variants: None   Review of the MIP images confirms the above findings   IMPRESSION: Normal CT head.   Normal CTA head and neck.  Cbc and cmp unremarkable except increased platelets  Review of Systems: Patient complains of symptoms  per HPI as well as the  following symptoms migraines. Pertinent negatives and positives per HPI. All others negative.   Social History   Socioeconomic History   Marital status: Single    Spouse name: Not on file   Number of children: Not on file   Years of education: Not on file   Highest education level: Not on file  Occupational History   Not on file  Tobacco Use   Smoking status: Light Smoker    Packs/day: 0.25    Types: Cigarettes   Smokeless tobacco: Never  Vaping Use   Vaping Use: Never used  Substance and Sexual Activity   Alcohol use: Yes    Comment: socially   Drug use: No   Sexual activity: Yes    Birth control/protection: None  Other Topics Concern   Not on file  Social History Narrative   Caffiene coffee 2 cups daily, no soda   Education: college   Work at [employer].    Social Determinants of Health   Financial Resource Strain: Not on file  Food Insecurity: Not on file  Transportation Needs: Not on file  Physical Activity: Not on file  Stress: Not on file  Social Connections: Not on file  Intimate Partner Violence: Not on file    Family History  Problem Relation Age of Onset   Hypertension Mother    Migraines Maternal Aunt    Aneurysm Maternal Aunt     Past Medical History:  Diagnosis Date   Medical history non-contributory     There are no problems to display for this patient.   Past Surgical History:  Procedure Laterality Date   NO PAST SURGERIES      Current Outpatient Medications  Medication Sig Dispense Refill   acetaminophen (TYLENOL) 500 MG tablet Take 1,000 mg by mouth every 8 (eight) hours as needed.     Aspirin-Acetaminophen-Caffeine (EXCEDRIN MIGRAINE PO) Take by mouth.     Etonogestrel (NEXPLANON [place]) Inject 1 Intra Uterine Device into the skin once.      ondansetron (ZOFRAN-ODT) 4 MG disintegrating tablet Take 1-2 tablets (4-8 mg total) by mouth every 8 (eight) hours as needed for nausea. As needed for nausea or can take with Rizatriptan for  migraine. 30 tablet 3   rizatriptan (MAXALT-MLT) 10 MG disintegrating tablet Take 1 tablet (10 mg total) by mouth as needed for migraine. May repeat in 2 hours if needed. May take with ondansetron for nausea with the migraine 9 tablet 11   topiramate (TOPAMAX) 50 MG tablet Take 1 tablet (50 mg total) by mouth at bedtime. Start with 50mg  at bedtime. 30 tablet 11   No current facility-administered medications for this visit.    Allergies as of 06/20/2022 - Review Complete 06/20/2022  Allergen Reaction Noted   Bactrim [sulfamethoxazole-trimethoprim] Itching and Rash 10/18/2016   Penicillins Rash and Other (See Comments) 02/10/2012    Vitals: BP 125/82   Pulse 75   Ht 5\' 5"  (1.651 m)   Wt 214 lb 3.2 oz (97.2 kg)   LMP 05/12/2022 (Approximate)   BMI 35.64 kg/m  Last Weight:  Wt Readings from Last 1 Encounters:  06/20/22 214 lb 3.2 oz (97.2 kg)   Last Height:   Ht Readings from Last 1 Encounters:  06/20/22 5\' 5"  (1.651 m)     Physical exam: Exam: Gen: NAD, conversant, well nourised, obese, well groomed  CV: RRR, no MRG. No Carotid Bruits. No peripheral edema, warm, nontender Eyes: Conjunctivae clear without exudates or hemorrhage  Neuro: Detailed Neurologic Exam  Speech:    Speech is normal; fluent and spontaneous with normal comprehension.  Cognition:    The patient is oriented to person, place, and time;     recent and remote memory intact;     language fluent;     normal attention, concentration,     fund of knowledge Cranial Nerves:    The pupils are equal, round, and reactive to light. The fundi are normal and spontaneous venous pulsations are present. Visual fields are full to finger confrontation. Extraocular movements are intact. Trigeminal sensation is intact and the muscles of mastication are normal. The face is symmetric. The palate elevates in the midline. Hearing intact. Voice is normal. Shoulder shrug is normal. The tongue has normal motion  without fasciculations.   Coordination:    Normal   Gait:    Heel-toe and tandem gait are normal.   Motor Observation:    No asymmetry, no atrophy, and no involuntary movements noted. Tone:    Normal muscle tone.    Posture:    Posture is normal. normal erect    Strength:    Strength is V/V in the upper and lower limbs.      Sensation: intact to LT     Reflex Exam:  DTR's:    Deep tendon reflexes in the upper and lower extremities are normal bilaterally.   Toes:    The toes are downgoing bilaterally.   Clonus:    Clonus is absent.    Assessment/Plan: This is a 32 year old patient with migraines for 12 years and fortunately it does not appear as though she has ever been on any migraine medications which is unfortunate because the newer medications such as CGRP's Ajovy Emgality Qulipta Nurtec Heather Casey is not available to her until she has tried several classes including medication such as Topamax, blood pressure medication such as propranolol, or amitriptyline nortriptyline before insurance will even entertain approving these medications.  We will have to start her from the beginning and spent time today educating her on migraines in general and migraine management.  I reviewed epic and "care everywhere" and she has never been to a neurologist. Gave her lots of literature to read.    Never been on any migraine-specific migraines, needs a primary care Recent CTA H&N normal 04/2022 Start on Topiramate 50mg  at bedtime with an option to go up to 100mg  PREVENTION DAILY: Start on the medications used in the past: Topiramate, Nortriptyline and then Propranolol for prevention. After you have tried these we can have access to the newer medications like Ajovy, Emgality and the new migraine meds AS NEEDED: Rizatriptan Please take one tablet right at the onset of your headache. If it does not improve the symptoms please take one additional tablet In 2 hours. Do not take more then 2 tablets in  24hrs. Do not take use more then 2 to 3 times in a week. Can take Rizatriptan with Ondansetron for nausea and together they work synergicastically for migraines Ondansetron: Alone for nausea or can take with rizatriptan for migraine  Meds ordered this encounter  Medications   ondansetron (ZOFRAN-ODT) 4 MG disintegrating tablet    Sig: Take 1-2 tablets (4-8 mg total) by mouth every 8 (eight) hours as needed for nausea. As needed for nausea or can take with Rizatriptan for migraine.    Dispense:  30 tablet  Refill:  3   rizatriptan (MAXALT-MLT) 10 MG disintegrating tablet    Sig: Take 1 tablet (10 mg total) by mouth as needed for migraine. May repeat in 2 hours if needed. May take with ondansetron for nausea with the migraine    Dispense:  9 tablet    Refill:  11   topiramate (TOPAMAX) 50 MG tablet    Sig: Take 1 tablet (50 mg total) by mouth at bedtime. Start with 50mg  at bedtime.    Dispense:  30 tablet    Refill:  11    Cc: No ref. provider found,  Patient, No Pcp Per  , MD  [facility] [address] [place], [place]  Phone [phone] Fax [phone]  I spent over 60 minutes of face-to-face and non-face-to-face time with patient on the  1. Chronic migraine without aura without status migrainosus, not intractable   2. Other fatigue    diagnosis.  This included previsit chart review, lab review, study review, order entry, electronic health record documentation, patient education on the different diagnostic and therapeutic options, counseling and coordination of care, risks and benefits of management, compliance, or risk factor reduction

## 2022-06-20 NOTE — Patient Instructions (Addendum)
PREVENTION DAILY: Start on the medications used in the past: Topiramate, Nortriptyline and then Propranolol for prevention. After you have tried these we can have access to the newer medications like Ajovy, Emgality and the new migraine meds AS NEEDED: Rizatriptan Please take one tablet right at the onset of your headache. If it does not improve the symptoms please take one additional tablet In 2 hours. Do not take more then 2 tablets in 24hrs. Do not take use more then 2 to 3 times in a week. Can take Rizatriptan with Ondansetron for nausea and together they work synergicastically for migraines Ondansetron: Alone for nausea or can take with rizatriptan for migraine  Analgesic Rebound Headache An analgesic rebound headache, sometimes called a medication overuse headache or a drug-induced headache, is a secondary disorder that is caused by the overuse of pain medicine (analgesic) to treat the original (primary) headache. Any type of primary headache can return as a rebound headache if a person regularly takes analgesics. The types of primary headaches that are commonly associated with rebound headaches include: Migraines. Headaches that are caused by tense muscles in the head and neck area (tension headaches). Headaches that develop and happen again on one side of the head and around the eye (cluster headaches). If rebound headaches continue, they can become long-term, daily headaches. What are the causes? This condition may be caused by frequent use of: Over-the-counter medicines such as aspirin, ibuprofen, and acetaminophen. Sinus-relief medicines and medicines that contain caffeine. Narcotic pain medicines such as codeine and oxycodone. Some prescription migraine medicines. What are the signs or symptoms? The symptoms of a rebound headache are the same as the symptoms of the original headache. Some of the symptoms of specific types of headaches include: Migraine headache Pulsing or throbbing  pain on one or both sides of the head. Severe pain that interferes with daily activities. Pain that gets worse with physical activity. Nausea, vomiting, or both. Pain and sensitivity with exposure to bright light, loud noises, or strong smells. Visual changes. Numbness of one or both arms. Tension headache Pressure around the head. Dull, aching head pain. Pain felt over the front and sides of the head. Tenderness in the muscles of the head, neck, and shoulders. Cluster headache Severe pain that begins in or around one eye or temple. Droopy or swollen eyelid, or redness and tearing in the eye on the same side as the pain. One-sided head pain. Nausea. Runny nose. Sweaty, pale facial skin. Restlessness. How is this diagnosed? This condition is diagnosed by: Reviewing your medical history. This includes the nature of your primary headaches. Reviewing the types of pain medicines that you have been using to treat your primary headaches and how often you take them. How is this treated? This condition may be treated or managed by: Discontinuing frequent use of the analgesic medicine. Doing this may worsen your headaches at first, but the pain should eventually become more manageable, less frequent, and less severe. Seeing a headache specialist. He or she may be able to help you manage your headaches and help make sure there is not another cause of the headaches. Using methods of stress relief, such as acupuncture, counseling, biofeedback, and massage. Talk with your health care provider about which methods might be good for you. Follow these instructions at home: Medicines  Take over-the-counter and prescription medicines only as told by your health care provider. Stop the repeated use of pain medicine as told by your health care provider. Stopping can be difficult. Carefully  follow instructions from your health care provider. Lifestyle  Follow a regular sleep schedule. Do not vary the  time that you go to bed or the amount that you sleep from day to day. It is important to stay on the same schedule to help prevent headaches. Get 7-9 hours of sleep each night, or the amount recommended by your health care provider. Exercise regularly. Exercise for at least 30 minutes, 5 times each week. Limit or manage stress. Consider stress-relief options such as acupuncture, counseling, biofeedback, and massage. Talk with your health care provider about which methods might be good for you. Do not drink alcohol. Do not use any products that contain nicotine or tobacco, such as cigarettes, e-cigarettes, and chewing tobacco. If you need help quitting, ask your health care provider. General instructions Avoid triggers that are known to cause your primary headaches. Keep all follow-up visits as told by your health care provider. This is important. Contact a health care provider if: You continue to experience headaches after following treatments that your health care provider recommended. Get help right away if you have: New headache pain. Headache pain that is different than what you have experienced in the past. Numbness or tingling in your arms or legs. Changes in your speech or vision. Summary An analgesic rebound headache, sometimes called a medication overuse headache or a drug-induced headache, is caused by the overuse of pain medicine (analgesic) to treat the original (primary) headache. Any type of primary headache can return as a rebound headache if a person regularly takes analgesics. The types of primary headaches that are commonly associated with rebound headaches include migraines, tension headaches, and cluster headaches. Analgesic rebound headaches can occur with frequent use of over-the-counter medicines and prescription medicines. Treatment involves stopping the medicine that is being overused. This will improve headache frequency and severity. This information is not intended to  replace advice given to you by your health care provider. Make sure you discuss any questions you have with your health care provider. Document Revised: 09/12/2019 Document Reviewed: 09/12/2019 Elsevier Patient Education  2023 [employer]  Migraine Headache A migraine headache is an intense, throbbing pain on one side or both sides of the head. Migraine headaches may also cause other symptoms, such as nausea, vomiting, and sensitivity to light and noise. A migraine headache can last from 4 hours to 3 days. Talk with your doctor about what things may bring on (trigger) your migraine headaches. What are the causes? The exact cause of this condition is not known. However, a migraine may be caused when nerves in the brain become irritated and release chemicals that cause inflammation of blood vessels. This inflammation causes pain. This condition may be triggered or caused by: Drinking alcohol. Smoking. Taking medicines, such as: Medicine used to treat chest pain (nitroglycerin). Birth control pills. Estrogen. Certain blood pressure medicines. Eating or drinking products that contain nitrates, glutamate, aspartame, or tyramine. Aged cheeses, chocolate, or caffeine may also be triggers. Doing physical activity. Other things that may trigger a migraine headache include: Menstruation. Pregnancy. Hunger. Stress. Lack of sleep or too much sleep. Weather changes. Fatigue. What increases the risk? The following factors may make you more likely to experience migraine headaches: Being a certain age. This condition is more common in people who are 63-64 years old. Being female. Having a family history of migraine headaches. Being Caucasian. Having a mental health condition, such as depression or anxiety. Being obese. What are the signs or symptoms? The main symptom of this  condition is pulsating or throbbing pain. This pain may: Happen in any area of the head, such as on one side or both  sides. Interfere with daily activities. Get worse with physical activity. Get worse with exposure to bright lights or loud noises. Other symptoms may include: Nausea. Vomiting. Dizziness. General sensitivity to bright lights, loud noises, or smells. Before you get a migraine headache, you may get warning signs (an aura). An aura may include: Seeing flashing lights or having blind spots. Seeing bright spots, halos, or zigzag lines. Having tunnel vision or blurred vision. Having numbness or a tingling feeling. Having trouble talking. Having muscle weakness. Some people have symptoms after a migraine headache (postdromal phase), such as: Feeling tired. Difficulty concentrating. How is this diagnosed? A migraine headache can be diagnosed based on: Your symptoms. A physical exam. Tests, such as: CT scan or an MRI of the head. These imaging tests can help rule out other causes of headaches. Taking fluid from the spine (lumbar puncture) and analyzing it (cerebrospinal fluid analysis, or CSF analysis). How is this treated? This condition may be treated with medicines that: Relieve pain. Relieve nausea. Prevent migraine headaches. Treatment for this condition may also include: Acupuncture. Lifestyle changes like avoiding foods that trigger migraine headaches. Biofeedback. Cognitive behavioral therapy. Follow these instructions at home: Medicines Take over-the-counter and prescription medicines only as told by your health care provider. Ask your health care provider if the medicine prescribed to you: Requires you to avoid driving or using heavy machinery. Can cause constipation. You may need to take these actions to prevent or treat constipation: Drink enough fluid to keep your urine pale yellow. Take over-the-counter or prescription medicines. Eat foods that are high in fiber, such as beans, whole grains, and fresh fruits and vegetables. Limit foods that are high in fat and  processed sugars, such as fried or sweet foods. Lifestyle Do not drink alcohol. Do not use any products that contain nicotine or tobacco, such as cigarettes, e-cigarettes, and chewing tobacco. If you need help quitting, ask your health care provider. Get at least 8 hours of sleep every night. Find ways to manage stress, such as meditation, deep breathing, or yoga. General instructions     Keep a journal to find out what may trigger your migraine headaches. For example, write down: What you eat and drink. How much sleep you get. Any change to your diet or medicines. If you have a migraine headache: Avoid things that make your symptoms worse, such as bright lights. It may help to lie down in a dark, quiet room. Do not drive or use heavy machinery. Ask your health care provider what activities are safe for you while you are experiencing symptoms. Keep all follow-up visits as told by your health care provider. This is important. Contact a health care provider if: You develop symptoms that are different or more severe than your usual migraine headache symptoms. You have more than 15 headache days in one month. Get help right away if: Your migraine headache becomes severe. Your migraine headache lasts longer than 72 hours. You have a fever. You have a stiff neck. You have vision loss. Your muscles feel weak or like you cannot control them. You start to lose your balance often. You have trouble walking. You faint. You have a seizure. Summary A migraine headache is an intense, throbbing pain on one side or both sides of the head. Migraines may also cause other symptoms, such as nausea, vomiting, and sensitivity to light  and noise. This condition may be treated with medicines and lifestyle changes. You may also need to avoid certain things that trigger a migraine headache. Keep a journal to find out what may trigger your migraine headaches. Contact your health care provider if you have  more than 15 headache days in a month or you develop symptoms that are different or more severe than your usual migraine headache symptoms. This information is not intended to replace advice given to you by your health care provider. Make sure you discuss any questions you have with your health care provider. Document Revised: 12/07/2018 Document Reviewed: 09/27/2018 Elsevier Patient Education  2023 [employer] Inc.  Ondansetron Dissolving Tablets What is this medication? ONDANSETRON (on DAN se tron) prevents nausea and vomiting from chemotherapy, radiation, or surgery. It works by blocking substances in the body that may cause nausea or vomiting. It belongs to a group of medications called antiemetics. This medicine may be used for other purposes; ask your health care provider or pharmacist if you have questions. COMMON BRAND NAME(S): Zofran ODT What should I tell my care team before I take this medication? They need to know if you have any of these conditions: Heart disease History of irregular heartbeat Liver disease Low levels of magnesium or potassium in the blood An unusual or allergic reaction to ondansetron, granisetron, other medications, foods, dyes, or preservatives Pregnant or trying to get pregnant Breast-feeding How should I use this medication? These tablets are made to dissolve in the mouth. Do not try to push the tablet through the foil backing. With dry hands, peel away the foil backing and gently remove the tablet. Place the tablet in the mouth and allow it to dissolve, then swallow. While you may take these tablets with water, it is not necessary to do so. Talk to your care team regarding the use of this medication in children. Special care may be needed. Overdosage: If you think you have taken too much of this medicine contact a poison control center or emergency room at once. NOTE: This medicine is only for you. Do not share this medicine with others. What if I miss a  dose? If you miss a dose, take it as soon as you can. If it is almost time for your next dose, take only that dose. Do not take double or extra doses. What may interact with this medication? Do not take this medication with any of the following: Apomorphine Certain medications for fungal infections like fluconazole, itraconazole, ketoconazole, posaconazole, voriconazole Cisapride Dronedarone Pimozide Thioridazine This medication may also interact with the following: Carbamazepine Certain medications for depression, anxiety, or psychotic disturbances Fentanyl Linezolid MAOIs like Carbex, Eldepryl, Marplan, Nardil, and Parnate Methylene blue (injected into a vein) Other medications that prolong the QT interval (cause an abnormal heart rhythm) like dofetilide, ziprasidone Phenytoin Rifampicin Tramadol This list may not describe all possible interactions. Give your health care provider a list of all the medicines, herbs, non-prescription drugs, or dietary supplements you use. Also tell them if you smoke, drink alcohol, or use illegal drugs. Some items may interact with your medicine. What should I watch for while using this medication? Check with your care team as soon as you can if you have any sign of an allergic reaction. What side effects may I notice from receiving this medication? Side effects that you should report to your care team as soon as possible: Allergic reactions--skin rash, itching, hives, swelling of the face, lips, tongue, or throat Bowel blockage--stomach cramping, unable  to have a bowel movement or pass gas, loss of appetite, vomiting Chest pain (angina)--pain, pressure, or tightness in the chest, neck, back, or arms Heart rhythm changes--fast or irregular heartbeat, dizziness, feeling faint or lightheaded, chest pain, trouble breathing Irritability, confusion, fast or irregular heartbeat, muscle stiffness, twitching muscles, sweating, high fever, seizure, chills,  vomiting, diarrhea, which may be signs of serotonin syndrome Side effects that usually do not require medical attention (report to your care team if they continue or are bothersome): Constipation Diarrhea General discomfort and fatigue Headache This list may not describe all possible side effects. Call your doctor for medical advice about side effects. You may report side effects to FDA at 1-800-FDA-1088. Where should I keep my medication? Keep out of the reach of children and pets. Store between 2 and 30 degrees C (36 and 86 degrees F). Throw away any unused medication after the expiration date. NOTE: This sheet is a summary. It may not cover all possible information. If you have questions about this medicine, talk to your doctor, pharmacist, or health care provider.  2023 Elsevier/Gold Standard (2004-09-03 00:00:00) Rizatriptan Tablets What is this medication? RIZATRIPTAN (rye za TRIP tan) treats migraines. It works by blocking pain signals and narrowing blood vessels in the brain. It belongs to a group of medications called triptans. It is not used to prevent migraines. This medicine may be used for other purposes; ask your health care provider or pharmacist if you have questions. COMMON BRAND NAME(S): Maxalt What should I tell my care team before I take this medication? They need to know if you have any of these conditions: Circulation problems in fingers and toes Diabetes Heart disease High blood pressure High cholesterol History of irregular heartbeat History of stroke Stomach or intestine problems Tobacco use An unusual or allergic reaction to rizatriptan, other medications, foods, dyes, or preservatives Pregnant or trying to get pregnant Breast-feeding How should I use this medication? Take this medication by mouth with water. Take it as directed on the prescription label. Do not use it more often than directed. Talk to your care team about the use of this medication in  children. While it may be prescribed for children as young as 6 years for selected conditions, precautions do apply. Overdosage: If you think you have taken too much of this medicine contact a poison control center or emergency room at once. NOTE: This medicine is only for you. Do not share this medicine with others. What if I miss a dose? This does not apply. This medication is not for regular use. What may interact with this medication? Do not take this medication with any of the following: Ergot alkaloids, such as dihydroergotamine, ergotamine MAOIs, such as Marplan, Nardil, Parnate Other medications for migraine headache, such as almotriptan, eletriptan, frovatriptan, naratriptan, sumatriptan, zolmitriptan This medication may also interact with the following: Certain medications for depression, anxiety, or other mental health conditions Propranolol This list may not describe all possible interactions. Give your health care provider a list of all the medicines, herbs, non-prescription drugs, or dietary supplements you use. Also tell them if you smoke, drink alcohol, or use illegal drugs. Some items may interact with your medicine. What should I watch for while using this medication? Visit your care team for regular checks on your progress. Tell your care team if your symptoms do not start to get better or if they get worse. This medication may affect your coordination, reaction time, or judgment. Do not drive or operate machinery until you  know how this medication affects you. Sit up or stand slowly to reduce the risk of dizzy or fainting spells. If you take migraine medications for 10 or more days a month, your migraines may get worse. Keep a diary of headache days and medication use. Contact your care team if your migraine attacks occur more frequently. What side effects may I notice from receiving this medication? Side effects that you should report to your care team as soon as  possible: Allergic reactions--skin rash, itching, hives, swelling of the face, lips, tongue, or throat Burning, pain, tingling, or color changes in the hands, arms, legs, or feet Heart attack--pain or tightness in the chest, shoulders, arms, or jaw, nausea, shortness of breath, cold or clammy skin, feeling faint or lightheaded Heart rhythm changes--fast or irregular heartbeat, dizziness, feeling faint or lightheaded, chest pain, trouble breathing Increase in blood pressure Irritability, confusion, fast or irregular heartbeat, muscle stiffness, twitching muscles, sweating, high fever, seizure, chills, vomiting, diarrhea, which may be signs of serotonin syndrome Raynaud syndrome--cool, numb, or painful fingers or toes that may change color from pale, to blue, to red Seizures Stroke--sudden numbness or weakness of the face, arm, or leg, trouble speaking, confusion, trouble walking, loss of balance or coordination, dizziness, severe headache, change in vision Sudden or severe stomach pain, bloody diarrhea, fever, nausea, vomiting Vision loss Side effects that usually do not require medical attention (report to your care team if they continue or are bothersome): Dizziness Unusual weakness or fatigue This list may not describe all possible side effects. Call your doctor for medical advice about side effects. You may report side effects to FDA at 1-800-FDA-1088. Where should I keep my medication? Keep out of the reach of children and pets. Store at room temperature between 15 and 30 degrees C (59 and 86 degrees F). Get rid of any unused medication after the expiration date. To get rid of medications that are no longer needed or have expired: Take the medication to a medication take-back program. Check with your pharmacy or law enforcement to find a location. If you cannot return the medication, check the label or package insert to see if the medication should be thrown out in the garbage or flushed  down the toilet. If you are not sure, ask your care team. If it is safe to put it in the trash, empty the medication out of the container. Mix the medication with cat litter, dirt, coffee grounds, or other unwanted substance. Seal the mixture in a bag or container. Put it in the trash. NOTE: This sheet is a summary. It may not cover all possible information. If you have questions about this medicine, talk to your doctor, pharmacist, or health care provider.  2023 Elsevier/Gold Standard (2021-12-16 00:00:00) Topiramate Tablets What is this medication? TOPIRAMATE (toe PYRE a mate) prevents and controls seizures in people with epilepsy. It may also be used to prevent migraine headaches. It works by calming overactive nerves in your body. This medicine may be used for other purposes; ask your health care provider or pharmacist if you have questions. COMMON BRAND NAME(S): Topamax, Topiragen What should I tell my care team before I take this medication? They need to know if you have any of these conditions: Bleeding disorder Kidney disease Lung disease Suicidal thoughts, plans, or attempt by you or a family member An unusual or allergic reaction to topiramate, other medications, foods, dyes, or preservatives Pregnant or trying to get pregnant Breast-feeding How should I use this medication? Take  this medication by mouth with water. Take it as directed on the prescription label at the same time every day. Do not cut, crush or chew this medicine. Swallow the tablets whole. You can take it with or without food. If it upsets your stomach, take it with food. Keep taking it unless your care team tells you to stop. A special MedGuide will be given to you by the pharmacist with each prescription and refill. Be sure to read this information carefully each time. Talk to your care team about the use of this medication in children. While it may be prescribed for children as young as 2 years for selected  conditions, precautions do apply. Overdosage: If you think you have taken too much of this medicine contact a poison control center or emergency room at once. NOTE: This medicine is only for you. Do not share this medicine with others. What if I miss a dose? If you miss a dose, take it as soon as you can unless it is within 6 hours of the next dose. If it is within 6 hours of the next dose, skip the missed dose. Take the next dose at the normal time. Do not take double or extra doses. What may interact with this medication? Acetazolamide Alcohol Antihistamines for allergy, cough, and cold Aspirin and aspirin-like medications Atropine Certain medications for anxiety or sleep Certain medications for bladder problems, such as oxybutynin, tolterodine Certain medications for depression, such as amitriptyline, fluoxetine, sertraline Certain medications for Parkinson disease, such as benztropine, trihexyphenidyl Certain medications for seizures, such as carbamazepine, lamotrigine, phenobarbital, phenytoin, primidone, valproic acid, zonisamide Certain medications for stomach problems, such as dicyclomine, hyoscyamine Certain medications for travel sickness, such as scopolamine Certain medications that treat or prevent blood clots, such as warfarin, enoxaparin, dalteparin, apixaban, dabigatran, rivaroxaban Digoxin Diltiazem Estrogen and progestin hormones General anesthetics, such as halothane, isoflurane, methoxyflurane, propofol Glyburide Hydrochlorothiazide Ipratropium Lithium Medications that relax muscles Metformin NSAIDs, medications for pain and inflammation, such as ibuprofen or naproxen Opioid medications for pain Phenothiazines, such as chlorpromazine, mesoridazine, prochlorperazine, thioridazine Pioglitazone This list may not describe all possible interactions. Give your health care provider a list of all the medicines, herbs, non-prescription drugs, or dietary supplements you use.  Also tell them if you smoke, drink alcohol, or use illegal drugs. Some items may interact with your medicine. What should I watch for while using this medication? Visit your care team for regular checks on your progress. Tell your care team if your symptoms do not start to get better or if they get worse. Do not suddenly stop taking this medication. You may develop a severe reaction. Your care team will tell you how much medication to take. If your care team wants you to stop the medication, the dose may be slowly lowered over time to avoid any side effects. Wear a medical ID bracelet or chain. Carry a card that describes your condition. List the medications and doses you take on the card. This medication may affect your coordination, reaction time, or judgment. Do not drive or operate machinery until you know how this medication affects you. Sit up or stand slowly to reduce the risk of dizzy or fainting spells. Drinking alcohol with this medication can increase the risk of these side effects. This medication may cause serious skin reactions. They can happen weeks to months after starting the medication. Contact your care team right away if you notice fevers or flu-like symptoms with a rash. The rash may be red or  purple and then turn into blisters or peeling of the skin. You may also notice a red rash with swelling of the face, lips, or lymph nodes in your neck or under your arms. This medication may cause thoughts of suicide or depression. This includes sudden changes in mood, behaviors, or thoughts. These changes can happen at any time but are more common in the beginning of treatment or after a change in dose. Call your care team right away if you experience these thoughts or worsening depression. This medication may slow your child's growth if it is taken for a long time at high doses. Your child's care team will monitor your child's growth. Using this medication for a long time may weaken your bones.  The risk of bone fractures may be increased. Talk to your care team about your bone health. Discuss this medication with your care team if you may be pregnant. Serious birth defects can occur if you take this medication during pregnancy. There are benefits and risks to taking medications during pregnancy. Your care team can help you find the option that works for you. Contraception is recommended while taking this medication. Estrogen and progestin hormones may not work as well while you are taking this medication. Your care team can help you find the option that works for you. Talk to your care team before breastfeeding. Changes to your treatment plan may be needed. What side effects may I notice from receiving this medication? Side effects that you should report to your care team as soon as possible: Allergic reactions--skin rash, itching, hives, swelling of the face, lips, tongue, or throat High acid level--trouble breathing, unusual weakness or fatigue, confusion, headache, fast or irregular heartbeat, nausea, vomiting High ammonia level--unusual weakness or fatigue, confusion, loss of appetite, nausea, vomiting, seizures Fever that does not go away, decrease in sweat Kidney stones--blood in the urine, pain or trouble passing urine, pain in the lower back or sides Redness, blistering, peeling or loosening of the skin, including inside the mouth Sudden eye pain or change in vision such as blurry vision, seeing halos around lights, vision loss Thoughts of suicide or self-harm, worsening mood, feelings of depression Side effects that usually do not require medical attention (report to your care team if they continue or are bothersome): Burning or tingling sensation in hands or feet Difficulty with paying attention, memory, or speech Dizziness Drowsiness Fatigue Loss of appetite with weight loss Slow or sluggish movements of the body This list may not describe all possible side effects. Call  your doctor for medical advice about side effects. You may report side effects to FDA at 1-800-FDA-1088. Where should I keep my medication? Keep out of the reach of children and pets. Store between 15 and 30 degrees C (59 and 86 degrees F). Protect from moisture. Keep the container tightly closed. Get rid of any unused medication after the expiration date. To get rid of medications that are no longer needed or have expired: Take the medication to a medication take-back program. Check with your pharmacy or law enforcement to find a location. If you cannot return the medication, check the label or package insert to see if the medication should be thrown out in the garbage or flushed down the toilet. If you are not sure, ask your care team. If it is safe to put it in the trash, empty the medication out of the container. Mix the medication with cat litter, dirt, coffee grounds, or other unwanted substance. Seal the mixture in  a bag or container. Put it in the trash. NOTE: This sheet is a summary. It may not cover all possible information. If you have questions about this medicine, talk to your doctor, pharmacist, or health care provider.  2023 Elsevier/Gold Standard (2007-10-06 00:00:00)

## 2022-06-21 LAB — TSH RFX ON ABNORMAL TO FREE T4: TSH: 0.905 u[IU]/mL (ref 0.450–4.500)

## 2022-06-21 NOTE — Telephone Encounter (Signed)
Forms completed, left in POD 4 for MD review and signature.

## 2022-06-21 NOTE — Telephone Encounter (Signed)
Pt fmla form @ front desk for p/u 

## 2022-06-21 NOTE — Telephone Encounter (Signed)
FMLA signed by MD, sent to medical records for processing.

## 2022-06-23 ENCOUNTER — Telehealth: Payer: Self-pay | Admitting: Neurology

## 2022-06-23 NOTE — Telephone Encounter (Signed)
I called pt back and relayed that she can take 1/2 tablet po qhs (25mg ) and do this for 2 wks and see if this will help with the drowsiness may increase back to 50mg  po qhs after that.  Will send to Dr. Jaynee Eagles and if any changes will let her know.  She verbalized understanding.   She had been missing work due to sleepiness.

## 2022-06-23 NOTE — Telephone Encounter (Signed)
I called pt and she is experiencing sleepiness with the topamax 50mg  po qhs.  She cannot tolerate. Is she able to cut in half (take 25mg  po qhs) for several weeks then up dose to 50mg .

## 2022-06-23 NOTE — Telephone Encounter (Signed)
Pt is calling. Stated she has a question about medication topiramate (TOPAMAX) 50 MG tablet. Pt is requesting a call back from nurse.

## 2022-07-14 ENCOUNTER — Telehealth: Payer: Self-pay | Admitting: Neurology

## 2022-07-14 NOTE — Telephone Encounter (Signed)
Pt is asking for a call to discuss the occasional migraines that she has while taking the topiramate (TOPAMAX) 50 MG tablet

## 2022-07-14 NOTE — Telephone Encounter (Signed)
Upon review of chart, patient had previously called in stating she had issues with drowsiness.  She was advised to try 25 mg of topiramate and see if she tolerates that better.  I called the patient.  She states that she did not decrease to 25 mg but she started taking the 50 mg earlier in the evening around 6 to 7 PM.  She states that works much better for her and she is not as tired when she wakes up in the morning.  She still notices some intermittent tingling of her hands and feet usually occurs in the morning and last about an hour or so.  She would like to proceed with the previously discussed option to increase to 100 mg daily.  I obtained a verbal order from Dr. Epimenio Foot.  Patient will increase to 100 mg now and she will take 2 of the 50 mg tablets.  Once she knows she is tolerating it well then she can ask for a new prescription for the 100 mg.  Patient's questions were answered and she verbalized appreciation for the call.

## 2022-07-23 ENCOUNTER — Encounter (HOSPITAL_COMMUNITY): Payer: Self-pay | Admitting: Emergency Medicine

## 2022-07-23 ENCOUNTER — Ambulatory Visit (HOSPITAL_COMMUNITY)
Admission: EM | Admit: 2022-07-23 | Discharge: 2022-07-23 | Disposition: A | Discharge status: Home / Self Care | Payer: Federal, State, Local not specified - PPO | Attending: Internal Medicine | Admitting: Internal Medicine

## 2022-07-23 DIAGNOSIS — Z1152 Encounter for screening for COVID-19: Secondary | ICD-10-CM | POA: Diagnosis not present

## 2022-07-23 DIAGNOSIS — J069 Acute upper respiratory infection, unspecified: Secondary | ICD-10-CM

## 2022-07-23 DIAGNOSIS — R059 Cough, unspecified: Secondary | ICD-10-CM | POA: Insufficient documentation

## 2022-07-23 DIAGNOSIS — Z79899 Other long term (current) drug therapy: Secondary | ICD-10-CM | POA: Insufficient documentation

## 2022-07-23 DIAGNOSIS — R0981 Nasal congestion: Secondary | ICD-10-CM | POA: Diagnosis present

## 2022-07-23 LAB — SARS CORONAVIRUS 2 (TAT 6-24 HRS): SARS Coronavirus 2: NEGATIVE

## 2022-07-23 MED ORDER — BENZONATATE 100 MG PO CAPS: 100.0000 mg | ORAL_CAPSULE | Freq: Three times a day (TID) | ORAL | 0 refills | Status: DC

## 2022-07-23 MED ORDER — GUAIFENESIN ER 1200 MG PO TB12: 1200.0000 mg | ORAL_TABLET | Freq: Two times a day (BID) | ORAL | 0 refills | Status: DC

## 2022-07-23 MED ORDER — IBUPROFEN 800 MG PO TABS: 800.0000 mg | ORAL_TABLET | Freq: Once | ORAL | Status: DC

## 2022-07-23 NOTE — ED Triage Notes (Signed)
Pt reports a productive cough, nasal congestion, sneezing, and chills. States symptoms began Thursday. Has been taking Nyquil for relief. Home covid test neg.

## 2022-07-23 NOTE — Discharge Instructions (Signed)
You have a viral upper respiratory infection.  COVID-19 testing is pending. We will call you with results if positive. If your COVID test is positive, you must stay at home until day 6 of symptoms. On day 6, you may go out into public and go back to work, but you must wear a mask until day 11 of symptoms to prevent spread to others.  Take guaifenesin 1200mg  every 12 hours to thin your mucous so that you can get it out of your body easier with coughing/blowing your nose. Drink plenty of water while taking this medication so that it works well in your body (at least 8 cups a day).   Take tessalon pearles every 8 hours as needed for cough.  You may take tylenol 1,000mg and ibuprofen 600mg every 6 hours with food as needed for fever/chills, sore throat, aches/pains, and inflammation associated with viral illness. Take this with food to avoid stomach upset.    You may do salt water and baking soda gargles every 4 hours as needed for your throat pain.  Please put 1 teaspoon of salt and 1/2 teaspoon of baking soda in 8 ounces of warm water then gargle and spit the water out. You may also put 1 tablespoon of honey in warm water and drink this to soothe your throat.  Place a humidifier in your room at night to help decrease dry air that can irritate your airway and cause you to have a sore throat and cough.  Please try to eat a well-balanced diet while you are sick so that your body gets proper nutrition to heal.  If you develop any new or worsening symptoms, please return.  If your symptoms are severe, please go to the emergency room.  Follow-up with your primary care provider for further evaluation and management of your symptoms as well as ongoing wellness visits.  I hope you feel better!  

## 2022-07-23 NOTE — ED Provider Notes (Signed)
[facility]    CSN: [id number] Arrival date & time: 07/23/22  1204      History   Chief Complaint Chief Complaint  Patient presents with   Cough   Nasal Congestion    HPI Heather Casey is a 31 y.o. female.   Patient presents to urgent care for evaluation of sore throat, nasal congestion, body aches, fever/chills, headache, cough, fatigue that started 2 days ago on July 21, 2022. Cough is productive with yellow sputum and worse at nighttime. Nasal congestion is thick and yellow.  Sore throat is worsened with swallowing. Reports chills but unknown highest temp at home. Denies body aches, shortness of breath, chest pain, nausea, vomiting, abdominal pain, diarrhea, dizziness, vision changes and eye drainage. No known sick contacts.  Denies history of asthma or chronic respiratory problems. Patient smokes cigarettes and denies other drug use. They are not vaccinated against COVID-19 and they have not received their seasonal flu vaccine this year.  Has attempted use of Nyquil prior to arrival at urgent care for relief of symptoms with some relief.  States she has been taking a small dose of NyQuil in the morning but a larger dose in the evenings prior to going to bed to help her sleep.  She has also been taking some of her daughter's leftover Keflex to help with her symptoms without relief.  She took a COVID-19 test at home when symptoms initially started and this was negative.   Cough   Past Medical History:  Diagnosis Date   Medical history non-contributory     There are no problems to display for this patient.   Past Surgical History:  Procedure Laterality Date   NO PAST SURGERIES      OB History     Gravida  3   Para  1   Term  1   Preterm      AB  2   Living  1      SAB      IAB  2   Ectopic      Multiple      Live Births  1            Home Medications    Prior to Admission medications   Medication Sig Start Date End Date Taking?  Authorizing Provider  benzonatate (TESSALON) 100 MG capsule Take 1 capsule (100 mg total) by mouth every 8 (eight) hours. 07/23/22  Yes Carlisle BeersStanhope, Adamarys Shall M, FNP  Guaifenesin 1200 MG TB12 Take 1 tablet (1,200 mg total) by mouth in the morning and at bedtime. 07/23/22  Yes Carlisle BeersStanhope, Tammela Bales M, FNP  acetaminophen (TYLENOL) 500 MG tablet Take 1,000 mg by mouth every 8 (eight) hours as needed.    [provider]  Aspirin-Acetaminophen-Caffeine (EXCEDRIN MIGRAINE PO) Take by mouth.    [provider]  Etonogestrel (NEXPLANON [place]) Inject 1 Intra Uterine Device into the skin once.     [provider]  ondansetron (ZOFRAN-ODT) 4 MG disintegrating tablet Take 1-2 tablets (4-8 mg total) by mouth every 8 (eight) hours as needed for nausea. As needed for nausea or can take with Rizatriptan for migraine. 06/20/22   Anson FretAhern, Antonia B, MD  rizatriptan (MAXALT-MLT) 10 MG disintegrating tablet Take 1 tablet (10 mg total) by mouth as needed for migraine. May repeat in 2 hours if needed. May take with ondansetron for nausea with the migraine 06/20/22   Anson FretAhern, Antonia B, MD  topiramate (TOPAMAX) 50 MG tablet Take 1 tablet (50 mg  total) by mouth at bedtime. Start with 50mg  at bedtime. Patient taking differently: Take 100 mg by mouth at bedtime. Start with 50mg  at bedtime. 06/20/22   , MD    Family History Family History  Problem Relation Age of Onset   Hypertension Mother    Migraines Maternal Aunt    Aneurysm Maternal Aunt     Social History Social History   Tobacco Use   Smoking status: Light Smoker    Packs/day: 0.25    Types: Cigarettes   Smokeless tobacco: Never  Vaping Use   Vaping Use: Never used  Substance Use Topics   Alcohol use: Yes    Comment: socially   Drug use: No     Allergies   Bactrim [sulfamethoxazole-trimethoprim] and Penicillins   Review of Systems Review of Systems  Respiratory:  Positive for cough.   Per HPI   Physical  Exam Triage Vital Signs ED Triage Vitals [07/23/22 1321]  Enc Vitals Group     BP 127/85     Pulse Rate 89     Resp 18     Temp 97.8 F (36.6 C)     Temp Source Oral     SpO2 98 %     Weight      Height      Head Circumference      Peak Flow      Pain Score 0     Pain Loc      Pain Edu?      Excl. in GC?    No data found.  Updated Vital Signs BP 127/85 (BP Location: Right Arm)   Pulse 89   Temp 97.8 F (36.6 C) (Oral)   Resp 18   SpO2 98%   Visual Acuity Right Eye Distance:   Left Eye Distance:   Bilateral Distance:    Right Eye Near:   Left Eye Near:    Bilateral Near:     Physical Exam Vitals and nursing note reviewed.  Constitutional:      Appearance: She is not ill-appearing or toxic-appearing.  HENT:     Head: Normocephalic and atraumatic.     Right Ear: Hearing, tympanic membrane, ear canal and external ear normal.     Left Ear: Hearing, tympanic membrane, ear canal and external ear normal.     Nose: Congestion and rhinorrhea present.     Mouth/Throat:     Lips: Pink.     Mouth: Mucous membranes are moist.     Pharynx: Posterior oropharyngeal erythema present.     Comments: Slight erythema to the posterior oropharynx with moderate amount of clear postnasal drainage visible. Eyes:     General: Lids are normal. Vision grossly intact. Gaze aligned appropriately.        Right eye: No discharge.        Left eye: No discharge.     Extraocular Movements: Extraocular movements intact.     Conjunctiva/sclera: Conjunctivae normal.     Pupils: Pupils are equal, round, and reactive to light.  Cardiovascular:     Rate and Rhythm: Normal rate and regular rhythm.     Heart sounds: Normal heart sounds, S1 normal and S2 normal.  Pulmonary:     Effort: Pulmonary effort is normal. No respiratory distress.     Breath sounds: Normal breath sounds and air entry.     Comments: No acute respiratory distress. Musculoskeletal:     Cervical back: Neck supple.   Lymphadenopathy:     Cervical:  Cervical adenopathy present.  Skin:    General: Skin is warm and dry.     Capillary Refill: Capillary refill takes less than 2 seconds.     Findings: No rash.  Neurological:     General: No focal deficit present.     Mental Status: She is alert and oriented to person, place, and time. Mental status is at baseline.     Cranial Nerves: No dysarthria or facial asymmetry.     Motor: No weakness.     Gait: Gait normal.  Psychiatric:        Mood and Affect: Mood normal.        Speech: Speech normal.        Behavior: Behavior normal.        Thought Content: Thought content normal.        Judgment: Judgment normal.      UC Treatments / Results  Labs (all labs ordered are listed, but only abnormal results are displayed) Labs Reviewed  SARS CORONAVIRUS 2 (TAT 6-24 HRS)    EKG   Radiology No results found.  Procedures Procedures (including critical care time)  Medications Ordered in UC Medications  ibuprofen (ADVIL) tablet 800 mg (has no administration in time range)    Initial Impression / Assessment and Plan / UC Course  I have reviewed the triage vital signs and the nursing notes.  Pertinent labs & imaging results that were available during my care of the patient were reviewed by me and considered in my medical decision making (see chart for details).   Viral URI with cough Symptoms and physical exam consistent with a viral upper respiratory tract infection that will likely resolve with rest, fluids, and prescriptions for symptomatic relief. No indication for imaging today based on stable cardiopulmonary exam and hemodynamically stable vital signs. COVID-19 testing is pending.  We will call patient if this is positive.  Quarantine guidelines discussed. Currently on day 2 of symptoms and does qualify for antiviral therapy.   Patient given ibuprofen in clinic today for sore throat.  Guaifenesin and Tessalon Perles sent to pharmacy for  symptomatic relief to be taken as prescribed for cough and nasal congestion.   May continue using NyQuil at bedtime as needed.  May use ibuprofen/tylenol over the counter for body aches, fever/chills, and overall discomfort associated with viral illness. Nonpharmacologic interventions for symptom relief provided and after visit summary below.   Strict ED/urgent care return precautions given.  Patient verbalizes understanding and agreement with plan.  Counseled patient regarding possible side effects and uses of all medications prescribed at today's visit.  Patient verbalizes understanding and agreement with plan.  All questions answered.  Patient discharged from urgent care in stable condition.       Final Clinical Impressions(s) / UC Diagnoses   Final diagnoses:  Viral URI with cough  Nasal congestion     Discharge Instructions      You have a viral upper respiratory infection.  COVID-19 testing is pending. We will call you with results if positive. If your COVID test is positive, you must stay at home until day 6 of symptoms. On day 6, you may go out into public and go back to work, but you must wear a mask until day 11 of symptoms to prevent spread to others.  Take guaifenesin 1200mg   every 12 hours to thin your mucous so that you can get it out of your body easier with coughing/blowing your nose. Drink plenty of water while taking  this medication so that it works well in your body (at least 8 cups a day).   Take tessalon pearles every 8 hours as needed for cough.  You may take tylenol 1,000mg  and ibuprofen 600mg  every 6 hours with food as needed for fever/chills, sore throat, aches/pains, and inflammation associated with viral illness. Take this with food to avoid stomach upset.    You may do salt water and baking soda gargles every 4 hours as needed for your throat pain.  Please put 1 teaspoon of salt and 1/2 teaspoon of baking soda in 8 ounces of warm water then gargle and spit  the water out. You may also put 1 tablespoon of honey in warm water and drink this to soothe your throat.  Place a humidifier in your room at night to help decrease dry air that can irritate your airway and cause you to have a sore throat and cough.  Please try to eat a well-balanced diet while you are sick so that your body gets proper nutrition to heal.  If you develop any new or worsening symptoms, please return.  If your symptoms are severe, please go to the emergency room.  Follow-up with your primary care provider for further evaluation and management of your symptoms as well as ongoing wellness visits.  I hope you feel better!      ED Prescriptions     Medication Sig Dispense Auth. Provider   Guaifenesin 1200 MG TB12 Take 1 tablet (1,200 mg total) by mouth in the morning and at bedtime. 14 tablet M, FNP   benzonatate (TESSALON) 100 MG capsule Take 1 capsule (100 mg total) by mouth every 8 (eight) hours. 21 capsule M, FNP      PDMP not reviewed this encounter.   Carlisle Beers, Carlisle Beers 07/23/22 1431

## 2022-07-30 ENCOUNTER — Ambulatory Visit (HOSPITAL_COMMUNITY)
Admission: EM | Admit: 2022-07-30 | Discharge: 2022-07-30 | Disposition: A | Discharge status: Home / Self Care | Payer: Federal, State, Local not specified - PPO

## 2022-07-31 ENCOUNTER — Encounter (HOSPITAL_COMMUNITY): Payer: Self-pay

## 2022-07-31 ENCOUNTER — Ambulatory Visit (HOSPITAL_COMMUNITY)
Admission: EM | Admit: 2022-07-31 | Discharge: 2022-07-31 | Disposition: A | Discharge status: Home / Self Care | Payer: Federal, State, Local not specified - PPO | Attending: Emergency Medicine | Admitting: Emergency Medicine

## 2022-07-31 VITALS — BP 117/72 | HR 89 | Temp 98.4°F | Resp 16

## 2022-07-31 DIAGNOSIS — R051 Acute cough: Secondary | ICD-10-CM

## 2022-07-31 DIAGNOSIS — J329 Chronic sinusitis, unspecified: Secondary | ICD-10-CM | POA: Diagnosis not present

## 2022-07-31 DIAGNOSIS — J40 Bronchitis, not specified as acute or chronic: Secondary | ICD-10-CM

## 2022-07-31 HISTORY — DX: Migraine, unspecified, not intractable, without status migrainosus: G43.909

## 2022-07-31 MED ORDER — DOXYCYCLINE HYCLATE 100 MG PO CAPS: 100.0000 mg | ORAL_CAPSULE | Freq: Two times a day (BID) | ORAL | 0 refills | Status: AC

## 2022-07-31 NOTE — ED Provider Notes (Signed)
[place]    CSN: [id number] Arrival date & time: 07/31/22  1153     History   Chief Complaint Chief Complaint  Patient presents with   Appt    1200   Cough    HPI Heather Casey is a 31 y.o. female.  Presents with 10-day history of nasal congestion and cough.  She was seen at this urgent care 11/25, possible viral etiology She has been using Mucinex, Delsym, Tessalon but reports cough has persisted, worsened last night Still having lots of sinus drainage No fevers.  Denies any trouble breathing  Past Medical History:  Diagnosis Date   Migraines     There are no problems to display for this patient.   Past Surgical History:  Procedure Laterality Date   NO PAST SURGERIES      OB History     Gravida  3   Para  1   Term  1   Preterm      AB  2   Living  1      SAB      IAB  2   Ectopic      Multiple      Live Births  1            Home Medications    Prior to Admission medications   Medication Sig Start Date End Date Taking? Authorizing Provider  benzonatate (TESSALON) 100 MG capsule Take 1 capsule (100 mg total) by mouth every 8 (eight) hours. 07/23/22  Yes Talbot Grumbling, FNP  doxycycline (VIBRAMYCIN) 100 MG capsule Take 1 capsule (100 mg total) by mouth 2 (two) times daily for 5 days. 07/31/22 08/05/22 Yes Jaana Brodt, PA-C  Etonogestrel (NEXPLANON [place]) Inject 1 Intra Uterine Device into the skin once.    Yes [provider]  topiramate (TOPAMAX) 50 MG tablet Take 1 tablet (50 mg total) by mouth at bedtime. Start with 50mg  at bedtime. Patient taking differently: Take 100 mg by mouth at bedtime. Start with 50mg  at bedtime. 06/20/22  Yes Melvenia Beam, MD  acetaminophen (TYLENOL) 500 MG tablet Take 1,000 mg by mouth every 8 (eight) hours as needed.    [provider]  Aspirin-Acetaminophen-Caffeine (EXCEDRIN MIGRAINE PO) Take by mouth.    [provider]  Guaifenesin 1200 MG TB12 Take 1  tablet (1,200 mg total) by mouth in the morning and at bedtime. 07/23/22   Talbot Grumbling, FNP  ondansetron (ZOFRAN-ODT) 4 MG disintegrating tablet Take 1-2 tablets (4-8 mg total) by mouth every 8 (eight) hours as needed for nausea. As needed for nausea or can take with Rizatriptan for migraine. 06/20/22   Melvenia Beam, MD  rizatriptan (MAXALT-MLT) 10 MG disintegrating tablet Take 1 tablet (10 mg total) by mouth as needed for migraine. May repeat in 2 hours if needed. May take with ondansetron for nausea with the migraine 06/20/22   Melvenia Beam, MD    Family History Family History  Problem Relation Age of Onset   Hypertension Mother    Migraines Maternal Aunt    Aneurysm Maternal Aunt     Social History Social History   Tobacco Use   Smoking status: Every Day    Packs/day: 0.25    Types: Cigarettes   Smokeless tobacco: Never  Vaping Use   Vaping Use: Never used  Substance Use Topics   Alcohol use: Yes    Comment: socially   Drug use: No     Allergies  Bactrim [sulfamethoxazole-trimethoprim] and Penicillins   Review of Systems Review of Systems  Respiratory:  Positive for cough.    As per HPI  Physical Exam Triage Vital Signs ED Triage Vitals  Enc Vitals Group     BP 07/31/22 1244 117/72     Pulse Rate 07/31/22 1244 89     Resp 07/31/22 1244 16     Temp 07/31/22 1244 98.4 F (36.9 C)     Temp Source 07/31/22 1244 Oral     SpO2 07/31/22 1244 99 %     Weight --      Height --      Head Circumference --      Peak Flow --      Pain Score 07/31/22 1245 6     Pain Loc --      Pain Edu? --      Excl. in [place]? --    No data found.  Updated Vital Signs BP 117/72   Pulse 89   Temp 98.4 F (36.9 C) (Oral)   Resp 16   SpO2 99%    Physical Exam Vitals and nursing note reviewed.  Constitutional:      General: She is not in acute distress. HENT:     Right Ear: Tympanic membrane and ear canal normal.     Left Ear: Tympanic membrane and ear  canal normal.     Nose: Congestion present.     Mouth/Throat:     Mouth: Mucous membranes are moist.     Pharynx: Uvula midline. No posterior oropharyngeal erythema.     Tonsils: No tonsillar exudate or tonsillar abscesses.  Eyes:     Conjunctiva/sclera: Conjunctivae normal.     Pupils: Pupils are equal, round, and reactive to light.  Cardiovascular:     Rate and Rhythm: Normal rate and regular rhythm.     Heart sounds: Normal heart sounds.  Pulmonary:     Effort: Pulmonary effort is normal. No respiratory distress.     Breath sounds: Normal breath sounds. No wheezing or rhonchi.  Abdominal:     General: Bowel sounds are normal.     Palpations: Abdomen is soft.     Tenderness: There is no abdominal tenderness.  Musculoskeletal:     Cervical back: Normal range of motion.  Lymphadenopathy:     Cervical: No cervical adenopathy.  Neurological:     Mental Status: She is alert and oriented to person, place, and time.     UC Treatments / Results  Labs (all labs ordered are listed, but only abnormal results are displayed) Labs Reviewed - No data to display  EKG  Radiology No results found.  Procedures Procedures   Medications Ordered in UC Medications - No data to display  Initial Impression / Assessment and Plan / UC Course  I have reviewed the triage vital signs and the nursing notes.  Pertinent labs & imaging results that were available during my care of the patient were reviewed by me and considered in my medical decision making (see chart for details).  Afebrile, lungs are clear, she is well appearing - defer x-ray at this time.  Will treat for possible sinobronchial etiology with Doxy twice daily for 5 days.  Especially given symptoms lasting 10+ days. Return precautions discussed. Patient agrees to plan  Final Clinical Impressions(s) / UC Diagnoses   Final diagnoses:  Sinobronchitis  Acute cough     Discharge Instructions      Please take medication as  prescribed. Take with  food to avoid upset stomach. You can continue the Delsym cough syrup. Continue to drink lots of water.    ED Prescriptions     Medication Sig Dispense Auth. Provider   doxycycline (VIBRAMYCIN) 100 MG capsule Take 1 capsule (100 mg total) by mouth 2 (two) times daily for 5 days. 10 capsule Akeylah Hendel, Lurena Joiner, PA-C      PDMP not reviewed this encounter.   Nyemah Watton, Ray Church 07/31/22 1357

## 2022-07-31 NOTE — Discharge Instructions (Signed)
Please take medication as prescribed. Take with food to avoid upset stomach. You can continue the Delsym cough syrup. Continue to drink lots of water.

## 2022-07-31 NOTE — ED Triage Notes (Addendum)
Pt was seen 11/25 for URI - had 2 negative Covid tests. States has had sxs x 1.5 wks and is not getting any better, and feels like she may be getting worse. Denies any known. C/O cough, congestion, runny nose, BLE leg aching. Has been taking Tessalon; has not been taking guaifenesin due to causing her to cough more. Has also been taking Nyquil and Delsym.

## 2022-09-06 ENCOUNTER — Encounter: Payer: Self-pay | Admitting: *Deleted

## 2022-09-07 NOTE — Telephone Encounter (Signed)
Called pt. Informed her that there aren't any Monday available and I will added her to the waist list and if something comes available we will contact her. Pt said okay and thanks for calling.

## 2022-09-08 NOTE — Telephone Encounter (Signed)
Rescheduled 10/18/22 vv with Megan to 09/16/22 at 11:00am

## 2022-09-16 ENCOUNTER — Telehealth: Payer: Federal, State, Local not specified - PPO | Admitting: Adult Health

## 2022-09-19 ENCOUNTER — Telehealth (INDEPENDENT_AMBULATORY_CARE_PROVIDER_SITE_OTHER): Payer: Federal, State, Local not specified - PPO | Admitting: Adult Health

## 2022-09-19 DIAGNOSIS — G43709 Chronic migraine without aura, not intractable, without status migrainosus: Secondary | ICD-10-CM

## 2022-09-19 MED ORDER — NORTRIPTYLINE HCL 10 MG PO CAPS: 10.0000 mg | ORAL_CAPSULE | Freq: Every day | ORAL | 3 refills | Status: DC

## 2022-09-19 NOTE — Patient Instructions (Addendum)
Your Plan:  Continue Topamax 100 mg at bedtime Add Nortriptyline 10 mg at bedtime Continue maxalt- take at the onset of migraine repeat in 2 hours if needed. If your symptoms worsen or you develop new symptoms please let us know.    Thank you for coming to see [place] at [facility]. I hope we have been able to provide you high quality care today.  You may receive a patient satisfaction survey over the next few weeks. We would appreciate your feedback and comments so that we may continue to improve ourselves and the health of our patients.

## 2022-09-19 NOTE — Telephone Encounter (Signed)
She asked about FMLA paperwork during the visit today. Do you have this?

## 2022-09-19 NOTE — Telephone Encounter (Signed)
Thanks

## 2022-09-19 NOTE — Progress Notes (Signed)
PATIENT: Heather Casey DOB: Jun 02, 1991  REASON FOR VISIT: follow up HISTORY FROM: patient  Virtual Visit via Video Note  I connected with Heather Casey on 09/19/22 at  1:00 PM EST by a video enabled telemedicine application located remotely at [facility] and verified that I am speaking with the correct person using two identifiers who was located at their own home.   I discussed the limitations of evaluation and management by telemedicine and the availability of in person appointments. The patient expressed understanding and agreed to proceed.   PATIENT: Heather Casey DOB: 06-Feb-1991  REASON FOR VISIT: follow up HISTORY FROM: patient  No chief complaint on file.    HISTORY OF PRESENT ILLNESS: Today 09/19/22:  Heather Casey is a 32 y.o. female with a history of Chronic Migraine. Returns today for follow-up. Currently taking Topamax 50 mg at bedtime. Initially made her sleepy but now tolerating it better. Not sure that her headaches are better. Having about 1 headache a week it can last 2-3 days.  It could skip a week. Headaches are tend to come when she is on her menstrual cycle. Has tried maxalt but was not always taking it at the onset of migraine.     HPI:  Heather Casey is a 32 y.o. female here as requested by No ref. provider found for migraines with recent migraine for 11 days.  Past medical history includes migraines otherwise noncontributory. She went to the ED 11-12 years ago for a migraine. She recently had a migraine 11 days straight. She always have migraines before her cycle. It can last usually 24-72 hours. She takes excedrin or ibuprofen. Sister had migraines. Radiate to the neck. Pulsating/pounding/throbbing. Nausea, Light and sound sensitivity. She can vomit. Menstruation is a trigger. OTC meds can help. She has on average 15 migraine days a month but she can go weeks without a headache but recently she had headaches 11 days. No other focal  neurologic deficits, associated symptoms, inciting events or modifiable factors.   Reviewed notes, labs and imaging from outside physicians, which showed:   From a thorough review of records, medications tried that can be used in migraine management include: Tylenol, Decadron injections, Benadryl, ibuprofen, meclizine, Robaxin, naproxen,   CTA H&N 05/29/2022: CLINICAL DATA:  Headache 11 days.  Rule out cerebral vasospasm.   EXAM: CT ANGIOGRAPHY HEAD AND NECK (personally reviewed images added 10 minutes to overall appointment)   TECHNIQUE: Multidetector CT imaging of the head and neck was performed using the standard protocol during bolus administration of intravenous contrast. Multiplanar CT image reconstructions and MIPs were obtained to evaluate the vascular anatomy. Carotid stenosis measurements (when applicable) are obtained utilizing NASCET criteria, using the distal internal carotid diameter as the denominator.   RADIATION DOSE REDUCTION: This exam was performed according to the departmental dose-optimization program which includes automated exposure control, adjustment of the mA and/or kV according to patient size and/or use of iterative reconstruction technique.   CONTRAST:  12mL OMNIPAQUE IOHEXOL 350 MG/ML SOLN   COMPARISON:  None Available.   FINDINGS: CT HEAD FINDINGS   Brain: No evidence of acute infarction, hemorrhage, hydrocephalus, extra-axial collection or mass lesion/mass effect.   Vascular: Negative for hyperdense vessel   Skull: Negative   Sinuses/Orbits: Negative   Other: None   Review of the MIP images confirms the above findings   CTA NECK FINDINGS   Aortic arch: Normal aortic arch. Proximal great vessels widely patent.   Right  carotid system: Normal right carotid. Negative for atherosclerotic disease or stenosis   Left carotid system: Normal left carotid. Negative for atherosclerotic disease or stenosis   Vertebral arteries: Normal  vertebral arteries bilaterally   Skeleton: Negative   Other neck: Negative for mass or adenopathy in the neck.   Upper chest: Lung apices clear bilaterally.   Review of the MIP images confirms the above findings   CTA HEAD FINDINGS   Anterior circulation: Internal carotid artery widely patent through the skull base and cavernous segment. Anterior and middle cerebral arteries normal bilaterally. No stenosis or vascular malformation   Posterior circulation: Both vertebral arteries patent to the basilar without stenosis. Small PICA patent bilaterally. Basilar patent. Posterior cerebral arteries patent bilaterally. No stenosis or vascular malformation   Venous sinuses: No venous thrombosis. 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   Anatomic variants: None   Review of the MIP images confirms the above findings   IMPRESSION: Normal CT head.   Normal CTA head and neck.   Cbc and cmp unremarkable except increased platelets    HISTORY   REVIEW OF SYSTEMS: Out of a complete 14 system review of symptoms, the patient complains only of the following symptoms, and all other reviewed systems are negative.  ALLERGIES: Allergies  Allergen Reactions   Bactrim [Sulfamethoxazole-Trimethoprim] Itching and Rash   Penicillins Rash and Other (See Comments)        HOME MEDICATIONS: Outpatient Medications Prior to Visit  Medication Sig Dispense Refill   acetaminophen (TYLENOL) 500 MG tablet Take 1,000 mg by mouth every 8 (eight) hours as needed.     Aspirin-Acetaminophen-Caffeine (EXCEDRIN MIGRAINE PO) Take by mouth.     benzonatate (TESSALON) 100 MG capsule Take 1 capsule (100 mg total) by mouth every 8 (eight) hours. 21 capsule 0   Etonogestrel (NEXPLANON [place]) Inject 1 Intra Uterine Device into the skin once.      Guaifenesin 1200 MG TB12 Take 1 tablet (1,200 mg total) by mouth in the morning and at bedtime. 14 tablet 0   ondansetron (ZOFRAN-ODT) 4 MG disintegrating  tablet Take 1-2 tablets (4-8 mg total) by mouth every 8 (eight) hours as needed for nausea. As needed for nausea or can take with Rizatriptan for migraine. 30 tablet 3   rizatriptan (MAXALT-MLT) 10 MG disintegrating tablet Take 1 tablet (10 mg total) by mouth as needed for migraine. May repeat in 2 hours if needed. May take with ondansetron for nausea with the migraine 9 tablet 11   topiramate (TOPAMAX) 50 MG tablet Take 1 tablet (50 mg total) by mouth at bedtime. Start with 50mg  at bedtime. (Patient taking differently: Take 100 mg by mouth at bedtime. Start with 50mg  at bedtime.) 30 tablet 11   No facility-administered medications prior to visit.    PAST MEDICAL HISTORY: Past Medical History:  Diagnosis Date   Migraines     PAST SURGICAL HISTORY: Past Surgical History:  Procedure Laterality Date   NO PAST SURGERIES      FAMILY HISTORY: Family History  Problem Relation Age of Onset   Hypertension Mother    Migraines Maternal Aunt    Aneurysm Maternal Aunt     SOCIAL HISTORY: Social History   Socioeconomic History   Marital status: Single    Spouse name: Not on file   Number of children: Not on file   Years of education: Not on file   Highest education level: Not on file  Occupational History   Not on file  Tobacco Use  Smoking status: Every Day    Packs/day: 0.25    Types: Cigarettes   Smokeless tobacco: Never  Vaping Use   Vaping Use: Never used  Substance and Sexual Activity   Alcohol use: Yes    Comment: socially   Drug use: No   Sexual activity: Not on file  Other Topics Concern   Not on file  Social History Narrative   Caffiene coffee 2 cups daily, no soda   Education: college   Work at [employer].    Social Determinants of Health   Financial Resource Strain: Not on file  Food Insecurity: Not on file  Transportation Needs: Not on file  Physical Activity: Not on file  Stress: Not on file  Social Connections: Not on file  Intimate Partner  Violence: Not on file      PHYSICAL EXAM Generalized: Well developed, in no acute distress   Neurological examination  Mentation: Alert oriented to time, place, history taking. Follows all commands speech and language fluent Cranial nerve II-XII:Extraocular movements were full. Facial symmetry noted. Marland Kitchen Head turning and shoulder shrug  were normal and symmetric.   DIAGNOSTIC DATA (LABS, IMAGING, TESTING) - I reviewed patient records, labs, notes, testing and imaging myself where available.  Lab Results  Component Value Date   WBC 6.8 06/07/2022   HGB 13.5 06/07/2022   HCT 40.0 06/07/2022   MCV 89.3 06/07/2022   PLT 435 (H) 06/07/2022      Component Value Date/Time   NA 136 06/07/2022 1817   K 4.2 06/07/2022 1817   CL 102 06/07/2022 1817   CO2 28 06/07/2022 1817   GLUCOSE 85 06/07/2022 1817   BUN 12 06/07/2022 1817   CREATININE 0.70 06/07/2022 1817   CALCIUM 9.5 06/07/2022 1817   GFRNONAA >60 06/07/2022 1817   GFRAA >60 02/12/2017 2324    Lab Results  Component Value Date   TSH 0.905 06/20/2022      ASSESSMENT AND PLAN 32 y.o. year old female  has a past medical history of Migraines. here with:  Migraine  Continue Topamax 100 mg at bedtime Start Nortriptyline 10 mg at bedtime. Reviewed side effects with the patient Continue Maxalt for abortive therapy. Take at the onset of migraine. Can repeat in 2 hours if needed. Cautioned about medication overuse FU in 2 months or sooner if needed     Ward Givens, MSN, NP-C 09/19/2022, 12:59 PM [facility] [address], [place], [place] [address] [phone]

## 2022-09-19 NOTE — Telephone Encounter (Signed)
Pt scheduled for vv with Megan for 09/19/22 at 1:00pm

## 2022-09-26 NOTE — Telephone Encounter (Signed)
Spoke with both Dr Jaynee Eagles and Jinny Blossom NP. Will allow 4 days per month for the next 6 months only. Pt has a follow-up in march. Form completed, signed, and sent to medical records for processing.

## 2022-09-27 ENCOUNTER — Telehealth: Payer: Self-pay | Admitting: *Deleted

## 2022-09-27 NOTE — Telephone Encounter (Signed)
Pt fmla form faxed to [place] copy @ front desk for patient.

## 2022-10-15 ENCOUNTER — Other Ambulatory Visit: Payer: Self-pay | Admitting: Adult Health

## 2022-10-18 ENCOUNTER — Telehealth: Payer: Federal, State, Local not specified - PPO | Admitting: Adult Health

## 2022-10-19 ENCOUNTER — Encounter: Payer: Self-pay | Admitting: Adult Health

## 2022-10-20 MED ORDER — TOPIRAMATE 50 MG PO TABS: ORAL_TABLET | ORAL | 11 refills | Status: DC

## 2022-10-20 NOTE — Telephone Encounter (Signed)
Ok to increase Topamax to 50 mg in AM and 100 mg at bedtime. Ok to stop nortriptyline.

## 2022-10-20 NOTE — Addendum Note (Signed)
Addended by: Gildardo Griffes on: 10/20/2022 01:19 PM   Modules accepted: Orders

## 2022-10-20 NOTE — Telephone Encounter (Signed)
Pt had requested a call about this. We will see what she is asking before sending this refill.

## 2022-10-20 NOTE — Telephone Encounter (Signed)
I spoke with the patient.  She started the nortriptyline 10 mg nightly a month ago.  She does not think she likes it.  Although she does report an improvement in headaches to only 2-3 over the past month, she states that in the last 2 weeks she has not been herself, has not gone to the gym, her weight was up by 4 pounds at her primary care visit yesterday.  She states she was given phentermine 37.5 mg to take in the mornings but her primary care recommended that she discuss the nortriptyline with our office since we prescribed as it may be contributing to her symptoms.  Patient also mentioned that the last time she refilled her topiramate which was in the last few weeks, she increased the dose to 100 mg at night.  She does not feel that topiramate suppresses her appetite. She asked if she could take 50 mg in the morning and 100 mg at night of topiramate.  I let her know I would discuss her medication with Megan and call her back with instructions. Patient verbalized appreciation.

## 2022-10-20 NOTE — Telephone Encounter (Signed)
Sent Rx to pharmacy for Topiramate 50 mg, take 1 tablet in AM and 2 tablets HS. Canceled Nortriptyline.

## 2022-11-11 ENCOUNTER — Telehealth: Payer: Federal, State, Local not specified - PPO | Admitting: Adult Health

## 2022-11-11 ENCOUNTER — Telehealth (INDEPENDENT_AMBULATORY_CARE_PROVIDER_SITE_OTHER): Payer: Federal, State, Local not specified - PPO | Admitting: Adult Health

## 2022-11-11 DIAGNOSIS — G43709 Chronic migraine without aura, not intractable, without status migrainosus: Secondary | ICD-10-CM | POA: Diagnosis not present

## 2022-11-11 MED ORDER — TOPIRAMATE 50 MG PO TABS: ORAL_TABLET | ORAL | 11 refills | Status: DC

## 2022-11-11 NOTE — Progress Notes (Signed)
PATIENT: Heather Casey DOB: 05/07/1991  REASON FOR VISIT: follow up HISTORY FROM: patient  Virtual Visit via Video Note  I connected with Heather Casey on 11/11/22 at  9:30 AM EDT by a video enabled telemedicine application located remotely at [facility] and verified that I am speaking with the correct person using two identifiers who was located at their own home.   I discussed the limitations of evaluation and management by telemedicine and the availability of in person appointments. The patient expressed understanding and agreed to proceed.   PATIENT: Heather Casey DOB: April 27, 1991  REASON FOR VISIT: follow up HISTORY FROM: patient   HISTORY OF PRESENT ILLNESS: Today 11/11/22:  Heather Casey is a 32 y.o. female with a history of migraines. Returns today for follow-up.  Tried Nortriptyline but she didn't feel good on this medication- her weight increased and Casey changes. Topamax does causes paresthesias in the hands but its manageable. Typically will get an increase in migraines around her migraines. 3 migraines in the last 3 weeks. Maxalt works well.  Right now she feels that Topamax is working well for her.  She currently has Nexplanon for birth control.  Did advise that Topamax may interfere with her birth control therefore she should use a backup method.  She advised that she was currently not sexually active.  Also advised that Topamax is not compatible with pregnancy.  Advised that if she considers pregnancy in the future this medication will need to be discontinued.  09/19/22: Heather Casey is a 32 y.o. female with a history of Chronic Migraine. Returns today for /follow-up. Currently taking Topamax 50 mg at bedtime. Initially made her sleepy but now tolerating it better. Not sure that her headaches are better. Having about 1 headache a week it c/an last 2-3 days.  It could skip a week. Headaches are tend to come when she is on her menstrual cycle. Has tried  maxalt but was not always taking it at the onset of migraine.     HPI:  Heather Casey is a 32 y.o. female here as requested by No ref. provider found for migraines with recent migraine for 11 days.  Past medical history includes migraines otherwise noncontributory. She went to the ED 11-12 years ago for a migraine. She recently had a migraine 11 days straight. She always have migraines before her cycle. It can last usually 24-72 hours. She takes excedrin or ibuprofen. Sister had migraines. Radiate to the neck. Pulsating/pounding/throbbing. Nausea, Light and sound sensitivity. She can vomit. Menstruation is a trigger. OTC meds can help. She has on average 15 migraine days a month but she can go weeks without a headache but recently she had headaches 11 days. No other focal neurologic deficits, associated symptoms, inciting events or modifiable factors.   Reviewed notes, labs and imaging from outside physicians, which showed:   From a thorough review of records, medications tried that can be used in migraine management include: Tylenol, Decadron injections, Benadryl, ibuprofen, meclizine, Robaxin, naproxen,   CTA H&N 05/29/2022: CLINICAL DATA:  Headache 11 days.  Rule out cerebral vasospasm.   EXAM: CT ANGIOGRAPHY HEAD AND NECK (personally reviewed images added 10 minutes to overall appointment)   TECHNIQUE: Multidetector CT imaging of the head and neck was performed using the standard protocol during bolus administration of intravenous contrast. Multiplanar CT image reconstructions and MIPs were obtained to evaluate the vascular anatomy. Carotid stenosis measurements (when applicable) are obtained utilizing NASCET  criteria, using the distal internal carotid diameter as the denominator.   RADIATION DOSE REDUCTION: This exam was performed according to the departmental dose-optimization program which includes automated exposure control, adjustment of the mA and/or kV according to patient size  and/or use of iterative reconstruction technique.   CONTRAST:  34mL OMNIPAQUE IOHEXOL 350 MG/ML SOLN   COMPARISON:  None Available.   FINDINGS: CT HEAD FINDINGS   Brain: No evidence of acute infarction, hemorrhage, hydrocephalus, extra-axial collection or mass lesion/mass effect.   Vascular: Negative for hyperdense vessel   Skull: Negative   Sinuses/Orbits: Negative   Other: None   Review of the MIP images confirms the above findings   CTA NECK FINDINGS   Aortic arch: Normal aortic arch. Proximal great vessels widely patent.   Right carotid system: Normal right carotid. Negative for atherosclerotic disease or stenosis   Left carotid system: Normal left carotid. Negative for atherosclerotic disease or stenosis   Vertebral arteries: Normal vertebral arteries bilaterally   Skeleton: Negative   Other neck: Negative for mass or adenopathy in the neck.   Upper chest: Lung apices clear bilaterally.   Review of the MIP images confirms the above findings   CTA HEAD FINDINGS   Anterior circulation: Internal carotid artery widely patent through the skull base and cavernous segment. Anterior and middle cerebral arteries normal bilaterally. No stenosis or vascular malformation   Posterior circulation: Both vertebral arteries patent to the basilar without stenosis. Small PICA patent bilaterally. Basilar patent. Posterior cerebral arteries patent bilaterally. No stenosis or vascular malformation   Venous sinuses: No venous thrombosis. 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   Anatomic variants: None   Review of the MIP images confirms the above findings   IMPRESSION: Normal CT head.   Normal CTA head and neck.   Cbc and cmp unremarkable except increased platelets    HISTORY   REVIEW OF SYSTEMS: Out of a complete 14 system review of symptoms, the patient complains only of the following symptoms, and all other reviewed systems are  negative.  ALLERGIES: Allergies  Allergen Reactions   Bactrim [Sulfamethoxazole-Trimethoprim] Itching and Rash   Penicillins Rash and Other (See Comments)        HOME MEDICATIONS: Outpatient Medications Prior to Visit  Medication Sig Dispense Refill   acetaminophen (TYLENOL) 500 MG tablet Take 1,000 mg by mouth every 8 (eight) hours as needed.     Aspirin-Acetaminophen-Caffeine (EXCEDRIN MIGRAINE PO) Take by mouth.     benzonatate (TESSALON) 100 MG capsule Take 1 capsule (100 mg total) by mouth every 8 (eight) hours. 21 capsule 0   Etonogestrel (NEXPLANON [place]) Inject 1 Intra Uterine Device into the skin once.      Guaifenesin 1200 MG TB12 Take 1 tablet (1,200 mg total) by mouth in the morning and at bedtime. 14 tablet 0   ondansetron (ZOFRAN-ODT) 4 MG disintegrating tablet Take 1-2 tablets (4-8 mg total) by mouth every 8 (eight) hours as needed for nausea. As needed for nausea or can take with Rizatriptan for migraine. 30 tablet 3   phentermine (ADIPEX-P) 37.5 MG tablet Take 37.5 mg by mouth daily before breakfast.     rizatriptan (MAXALT-MLT) 10 MG disintegrating tablet Take 1 tablet (10 mg total) by mouth as needed for migraine. May repeat in 2 hours if needed. May take with ondansetron for nausea with the migraine 9 tablet 11   topiramate (TOPAMAX) 50 MG tablet Take 50 mg (1 tablet) by mouth in the morning and 100 mg (2 tablets) by  mouth at bedtime. 90 tablet 11   No facility-administered medications prior to visit.    PAST MEDICAL HISTORY: Past Medical History:  Diagnosis Date   Migraines     PAST SURGICAL HISTORY: Past Surgical History:  Procedure Laterality Date   NO PAST SURGERIES      FAMILY HISTORY: Family History  Problem Relation Age of Onset   Hypertension Mother    Migraines Maternal Aunt    Aneurysm Maternal Aunt     SOCIAL HISTORY: Social History   Socioeconomic History   Marital status: Single    Spouse name: Not on file   Number of children: Not on  file   Years of education: Not on file   Highest education level: Not on file  Occupational History   Not on file  Tobacco Use   Smoking status: Every Day    Packs/day: .25    Types: Cigarettes   Smokeless tobacco: Never  Vaping Use   Vaping Use: Never used  Substance and Sexual Activity   Alcohol use: Yes    Comment: socially   Drug use: No   Sexual activity: Not on file  Other Topics Concern   Not on file  Social History Narrative   Caffiene coffee 2 cups daily, no soda   Education: college   Work at post [employer].    Social Determinants of Health   Financial Resource Strain: Not on file  Food Insecurity: Not on file  Transportation Needs: Not on file  Physical Activity: Not on file  Stress: Not on file  Social Connections: Not on file  Intimate Partner Violence: Not on file      PHYSICAL EXAM Generalized: Well developed, in no acute distress   Neurological examination  Mentation: Alert oriented to time, place, history taking. Follows all commands speech and language fluent Cranial nerve II-XII:Extraocular movements were full. Facial symmetry noted. Head turning and shoulder shrug  were normal and symmetric.   DIAGNOSTIC DATA (LABS, IMAGING, TESTING) - I reviewed patient records, labs, notes, testing and imaging myself where available.  Lab Results  Component Value Date   WBC 6.8 06/07/2022   HGB 13.5 06/07/2022   HCT 40.0 06/07/2022   MCV 89.3 06/07/2022   PLT 435 (H) 06/07/2022      Component Value Date/Time   NA 136 06/07/2022 1817   K 4.2 06/07/2022 1817   CL 102 06/07/2022 1817   CO2 28 06/07/2022 1817   GLUCOSE 85 06/07/2022 1817   BUN 12 06/07/2022 1817   CREATININE 0.70 06/07/2022 1817   CALCIUM 9.5 06/07/2022 1817   GFRNONAA >60 06/07/2022 1817   GFRAA >60 02/12/2017 2324    Lab Results  Component Value Date   TSH 0.905 06/20/2022      ASSESSMENT AND PLAN 32 y.o. year old female  has a past medical history of Migraines. here  with:  Migraine  Continue Topamax 50 mg in the morning 100 mg at bedtime Continue Maxalt for abortive therapy. Take at the onset of migraine. Can repeat in 2 hours if needed. Cautioned about medication overuse Reviewed the interaction with Topamax and her birth control.  Advised patient that she should use a backup method if she is sexually active.  Also advised that this medication is not compatible with pregnancy and will need to be discontinued before she starts trying to conceive. FU in 6-7 months or sooner if needed  I spent 30 minutes of face-to-face and non-face-to-face time with patient.  This included previsit chart  review, discussion of migraines, discussion of medication particularly Topamax in relation to pregnancy and interaction with her birth control   Ward Givens, MSN, NP-C 11/11/2022, 9:17 AM [facility] [address], [name], [place] [address] [phone]

## 2022-11-11 NOTE — Patient Instructions (Signed)
Your Plan:  Continue Topamax Topamax may decrease the effectiveness of your birth control.  Use a backup method if you are sexually active.  Topamax is also not compatible in pregnancy.  This medication will need to be discontinued if you are planning conception.  If Topamax is not helpful can consider switching you over to [place] I have attached some information about this drug     Thank you for coming to see [place] at [facility]. I hope we have been able to provide you high quality care today.  You may receive a patient satisfaction survey over the next few weeks. We would appreciate your feedback and comments so that we may continue to improve ourselves and the health of our patients.

## 2023-03-31 ENCOUNTER — Other Ambulatory Visit (HOSPITAL_COMMUNITY): Payer: Self-pay

## 2023-03-31 MED ORDER — SEMAGLUTIDE-WEIGHT MANAGEMENT 0.5 MG/0.5ML ~~LOC~~ SOAJ
0.5000 mg | SUBCUTANEOUS | 0 refills | Status: DC
  Filled 2023-05-03: qty 2, 28d supply, fill #0

## 2023-03-31 MED ORDER — SEMAGLUTIDE-WEIGHT MANAGEMENT 0.25 MG/0.5ML ~~LOC~~ SOAJ
0.2500 mg | SUBCUTANEOUS | 0 refills | Status: DC
  Filled 2023-03-31: qty 2, 28d supply, fill #0

## 2023-05-03 ENCOUNTER — Other Ambulatory Visit: Payer: Self-pay

## 2023-05-03 ENCOUNTER — Other Ambulatory Visit (HOSPITAL_COMMUNITY): Payer: Self-pay

## 2023-05-29 ENCOUNTER — Other Ambulatory Visit (HOSPITAL_COMMUNITY): Payer: Self-pay

## 2023-05-29 MED ORDER — WEGOVY 1.7 MG/0.75ML ~~LOC~~ SOAJ
1.7000 mg | SUBCUTANEOUS | 0 refills | Status: DC
  Filled 2023-07-17 – 2023-08-28 (×2): qty 3, 28d supply, fill #0

## 2023-05-29 MED ORDER — WEGOVY 1 MG/0.5ML ~~LOC~~ SOAJ
1.0000 mg | SUBCUTANEOUS | 0 refills | Status: DC
  Filled 2023-05-29 – 2023-06-08 (×2): qty 2, 28d supply, fill #0

## 2023-06-04 NOTE — Progress Notes (Unsigned)
PATIENT: Heather Casey DOB: [birth year]  REASON FOR VISIT: follow up HISTORY FROM: patient PRIMARY NEUROLOGIST: Dr. Lucia Gaskins   Chief Complaint  Patient presents with   RM 5    Patient is here alone for migraine follow-up. She states her migraines have been pretty good lately and haven't been "that bad". She is on Topiramate 50 mg AM and 100 mg PM plus Rizatriptan and Zofran PRN.      HISTORY OF PRESENT ILLNESS: Today 06/04/23  Heather Casey is a 32 y.o. female who has been followed in this office for Migraines. Returns today for follow-up.  She reports that overall her migraines are relatively stable.  She currently is getting about 2 migraines a month.  Continues on Topamax 50 mg in the morning and 100 mg in the evening.  She has Maxalt but typically uses Excedrin.  Currently this is working well for her headaches   HISTORY 11/11/22:   Heather Casey is a 32 y.o. female with a history of migraines. Returns today for follow-up.  Tried Nortriptyline but she didn't feel good on this medication- her weight increased and mood changes. Topamax does causes paresthesias in the hands but its manageable. Typically will get an increase in migraines around her migraines. 3 migraines in the last 3 weeks. Maxalt works well.  Right now she feels that Topamax is working well for her.  She currently has Nexplanon for birth control.  Did advise that Topamax may interfere with her birth control therefore she should use a backup method.  She advised that she was currently not sexually active.  Also advised that Topamax is not compatible with pregnancy.  Advised that if she considers pregnancy in the future this medication will need to be discontinued.   09/19/22: Heather Casey is a 32 y.o. female with a history of Chronic Migraine. Returns today for /follow-up. Currently taking Topamax 50 mg at bedtime. Initially made her sleepy but now tolerating it better. Not sure that her headaches are better. Having about 1  headache a week it c/an last 2-3 days.  It could skip a week. Headaches are tend to come when she is on her menstrual cycle. Has tried maxalt but was not always taking it at the onset of migraine.   REVIEW OF SYSTEMS: Out of a complete 14 system review of symptoms, the patient complains only of the following symptoms, and all other reviewed systems are negative.  ALLERGIES: Allergies  Allergen Reactions   Bactrim [Sulfamethoxazole-Trimethoprim] Itching and Rash   Penicillins Rash and Other (See Comments)        HOME MEDICATIONS: Outpatient Medications Prior to Visit  Medication Sig Dispense Refill   acetaminophen (TYLENOL) 500 MG tablet Take 1,000 mg by mouth every 8 (eight) hours as needed.     Aspirin-Acetaminophen-Caffeine (EXCEDRIN MIGRAINE PO) Take by mouth.     benzonatate (TESSALON) 100 MG capsule Take 1 capsule (100 mg total) by mouth every 8 (eight) hours. 21 capsule 0   Etonogestrel (NEXPLANON [place]) Inject 1 Intra Uterine Device into the skin once.      Guaifenesin 1200 MG TB12 Take 1 tablet (1,200 mg total) by mouth in the morning and at bedtime. 14 tablet 0   ondansetron (ZOFRAN-ODT) 4 MG disintegrating tablet Take 1-2 tablets (4-8 mg total) by mouth every 8 (eight) hours as needed for nausea. As needed for nausea or can take with Rizatriptan for migraine. 30 tablet 3   rizatriptan (MAXALT-MLT) 10 MG disintegrating tablet Take  1 tablet (10 mg total) by mouth as needed for migraine. May repeat in 2 hours if needed. May take with ondansetron for nausea with the migraine 9 tablet 11   Semaglutide-Weight Management (WEGOVY) 1 MG/0.5ML SOAJ Inject 1 mg into the skin once a week. 2 mL 0   Semaglutide-Weight Management (WEGOVY) 1.7 MG/0.75ML SOAJ Inject 1.7 mg into the skin once a week. 3 mL 0   Semaglutide-Weight Management 0.25 MG/0.5ML SOAJ Inject 0.25 mg into the skin once a week for 28 days. 2 mL 0   Semaglutide-Weight Management 0.5 MG/0.5ML SOAJ Inject 0.5 mg into the skin once a  week. 2 mL 0   topiramate (TOPAMAX) 50 MG tablet Take 50 mg (1 tablet) by mouth in the morning and 100 mg (2 tablets) by mouth at bedtime. 90 tablet 11   No facility-administered medications prior to visit.    PAST MEDICAL HISTORY: Past Medical History:  Diagnosis Date   Migraines     PAST SURGICAL HISTORY: Past Surgical History:  Procedure Laterality Date   NO PAST SURGERIES      FAMILY HISTORY: Family History  Problem Relation Age of Onset   Hypertension Mother    Migraines Maternal Aunt    Aneurysm Maternal Aunt     SOCIAL HISTORY: Social History   Socioeconomic History   Marital status: Single    Spouse name: Not on Casey   Number of children: Not on Casey   Years of education: Not on Casey   Highest education level: Not on Casey  Occupational History   Not on Casey  Tobacco Use   Smoking status: Every Day    Current packs/day: 0.25    Types: Cigarettes   Smokeless tobacco: Never  Vaping Use   Vaping status: Never Used  Substance and Sexual Activity   Alcohol use: Yes    Comment: socially   Drug use: No   Sexual activity: Not on Casey  Other Topics Concern   Not on Casey  Social History Narrative   Caffiene coffee 2 cups daily, no soda   Education: college   Work at post office.    Social Determinants of Health   Financial Resource Strain: Low Risk  (10/16/2022)   Received from [facility], [facility]   Overall Financial Resource Strain (CARDIA)    Difficulty of Paying Living Expenses: Not very hard  Food Insecurity: No Food Insecurity (10/16/2022)   Received from [facility], [facility]   Hunger Vital Sign    Worried About Running Out of Food in the Last Year: Never true    Ran Out of Food in the Last Year: Never true  Transportation Needs: No Transportation Needs (10/16/2022)   Received from [facility], [facility]   PRAPARE - Transportation    Lack of Transportation (Medical): No    Lack of Transportation (Non-Medical): No  Physical  Activity: Sufficiently Active (10/16/2022)   Received from [facility], [facility]   Exercise Vital Sign    Days of Exercise per Week: 5 days    Minutes of Exercise per Session: 150+ min  Stress: No Stress Concern Present (10/16/2022)   Received from [facility], [facility] - Occupational Stress Questionnaire    Feeling of Stress : Only a little  Social Connections: Socially Isolated (10/16/2022)   Received from [facility], [facility]   Social Network    How would you rate your social network (family, work, friends)?: Little participation, lonely  and socially isolated  Intimate Partner Violence: Not At Risk (10/16/2022)   Received from [facility], [facility]   HITS    Over the last 12 months how often did your partner physically hurt you?: 1    Over the last 12 months how often did your partner insult you or talk down to you?: 1    Over the last 12 months how often did your partner threaten you with physical harm?: 1    Over the last 12 months how often did your partner scream or curse at you?: 1      PHYSICAL EXAM  Vitals:   06/05/23 1413  BP: 116/74  Pulse: 83  Weight: 200 lb (90.7 kg)  Height: 5\' 4"  (1.626 m)   Body mass index is 34.33 kg/m.  Generalized: Well developed, in no acute distress   Neurological examination  Mentation: Alert oriented to time, place, history taking. Follows all commands speech and language fluent Cranial nerve II-XII: Pupils were equal round reactive to light. Extraocular movements were full, visual field were full on confrontational test. Facial sensation and strength were normal.  Head turning and shoulder shrug  were normal and symmetric. Motor: The motor testing reveals 5 over 5 strength of all 4 extremities. Good symmetric motor tone is noted throughout.  Sensory: Sensory testing is intact to soft touch on all 4 extremities. No evidence of extinction is noted.  Coordination:  Cerebellar testing reveals good finger-nose-finger and heel-to-shin bilaterally.  Gait and station: Gait is normal. Reflexes: Deep tendon reflexes are symmetric and normal bilaterally.   DIAGNOSTIC DATA (LABS, IMAGING, TESTING) - I reviewed patient records, labs, notes, testing and imaging myself where available.  Lab Results  Component Value Date   WBC 6.8 06/07/2022   HGB 13.5 06/07/2022   HCT 40.0 06/07/2022   MCV 89.3 06/07/2022   PLT 435 (H) 06/07/2022      Component Value Date/Time   NA 136 06/07/2022 1817   K 4.2 06/07/2022 1817   CL 102 06/07/2022 1817   CO2 28 06/07/2022 1817   GLUCOSE 85 06/07/2022 1817   BUN 12 06/07/2022 1817   CREATININE 0.70 06/07/2022 1817   CALCIUM 9.5 06/07/2022 1817   GFRNONAA >60 06/07/2022 1817   GFRAA >60 02/12/2017 2324   Lab Results  Component Value Date   TSH 0.905 06/20/2022      ASSESSMENT AND PLAN 32 y.o. year old female  has a past medical history of Migraines. here with:  Migraine   Continue Topamax 50 mg in the morning 100 mg at bedtime Continue Maxalt for abortive therapy. Take at the onset of migraine. Can repeat in 2 hours if needed. Cautioned about medication overuse Again.,reviewed the interaction with Topamax and her birth control.  Advised patient that she should use a backup method if she is sexually active.  Also advised that this medication is not compatible with pregnancy. FU in 6-7 months or sooner if needed     Butch Penny, MSN, NP-C 06/04/2023, 1:38 PM [facility] [address] [place], [place] [address] [phone]

## 2023-06-05 ENCOUNTER — Encounter: Payer: Self-pay | Admitting: Adult Health

## 2023-06-05 ENCOUNTER — Other Ambulatory Visit (HOSPITAL_COMMUNITY): Payer: Self-pay

## 2023-06-05 ENCOUNTER — Ambulatory Visit: Payer: Federal, State, Local not specified - PPO | Admitting: Adult Health

## 2023-06-05 VITALS — BP 116/74 | HR 83 | Ht 64.0 in | Wt 200.0 lb

## 2023-06-05 DIAGNOSIS — G43709 Chronic migraine without aura, not intractable, without status migrainosus: Secondary | ICD-10-CM | POA: Diagnosis not present

## 2023-06-05 MED ORDER — TOPIRAMATE 50 MG PO TABS
250.0000 mg | ORAL_TABLET | Freq: Every day | ORAL | 3 refills | Status: AC
  Filled 2023-06-05: qty 270, 54d supply, fill #0

## 2023-06-05 MED ORDER — TOPIRAMATE 50 MG PO TABS: ORAL_TABLET | ORAL | 3 refills | Status: DC

## 2023-06-08 ENCOUNTER — Other Ambulatory Visit (HOSPITAL_COMMUNITY): Payer: Self-pay

## 2023-06-29 ENCOUNTER — Telehealth: Payer: Self-pay | Admitting: *Deleted

## 2023-06-29 DIAGNOSIS — Z0289 Encounter for other administrative examinations: Secondary | ICD-10-CM

## 2023-06-29 NOTE — Telephone Encounter (Signed)
Received FMLA form to complete for intermittent FMLA.  I completed the form, last year 09-21-2022 signed was for 4 days/month.

## 2023-07-06 ENCOUNTER — Telehealth: Payer: Self-pay | Admitting: *Deleted

## 2023-07-06 NOTE — Telephone Encounter (Signed)
Pt fmla form faxed today and copy @ front desk for pt.

## 2023-07-06 NOTE — Telephone Encounter (Signed)
Form signed.  To medical records.

## 2023-07-17 ENCOUNTER — Other Ambulatory Visit (HOSPITAL_COMMUNITY): Payer: Self-pay

## 2023-07-26 ENCOUNTER — Other Ambulatory Visit (HOSPITAL_COMMUNITY): Payer: Self-pay

## 2023-08-28 ENCOUNTER — Other Ambulatory Visit (HOSPITAL_COMMUNITY): Payer: Self-pay

## 2023-08-28 MED ORDER — WEGOVY 1 MG/0.5ML ~~LOC~~ SOAJ
1.0000 mg | SUBCUTANEOUS | 0 refills | Status: DC
  Filled 2023-08-28: qty 2, 28d supply, fill #0

## 2023-08-28 MED ORDER — WEGOVY 1.7 MG/0.75ML ~~LOC~~ SOAJ
1.7000 mg | SUBCUTANEOUS | 0 refills | Status: DC
  Filled 2023-08-28: qty 3, 28d supply, fill #0

## 2023-10-16 ENCOUNTER — Other Ambulatory Visit (HOSPITAL_COMMUNITY): Payer: Self-pay

## 2023-10-16 MED ORDER — WEGOVY 1.7 MG/0.75ML ~~LOC~~ SOAJ
1.7000 mg | SUBCUTANEOUS | 0 refills | Status: DC
Start: 2023-10-16 — End: 2023-10-30
  Filled 2023-10-16: qty 3, 28d supply, fill #0

## 2023-10-16 MED ORDER — MELOXICAM 15 MG PO TABS
15.0000 mg | ORAL_TABLET | Freq: Every day | ORAL | 2 refills | Status: DC
  Filled 2023-10-16: qty 30, 30d supply, fill #0

## 2023-10-26 ENCOUNTER — Other Ambulatory Visit (HOSPITAL_COMMUNITY): Payer: Self-pay

## 2023-10-27 ENCOUNTER — Other Ambulatory Visit (HOSPITAL_COMMUNITY): Payer: Self-pay

## 2023-10-30 ENCOUNTER — Other Ambulatory Visit (HOSPITAL_COMMUNITY): Payer: Self-pay

## 2023-10-30 ENCOUNTER — Encounter (HOSPITAL_COMMUNITY): Payer: Self-pay

## 2023-10-30 MED ORDER — WEGOVY 2.4 MG/0.75ML ~~LOC~~ SOAJ
2.4000 mg | SUBCUTANEOUS | 3 refills | Status: DC
  Filled 2023-10-30: qty 3, 28d supply, fill #0

## 2023-11-03 ENCOUNTER — Other Ambulatory Visit (HOSPITAL_COMMUNITY): Payer: Self-pay

## 2023-11-03 MED ORDER — WEGOVY 2.4 MG/0.75ML ~~LOC~~ SOAJ
2.4000 mg | SUBCUTANEOUS | 3 refills | Status: DC
  Filled 2023-11-03: qty 3, 28d supply, fill #0

## 2023-11-07 ENCOUNTER — Telehealth: Payer: Self-pay | Admitting: *Deleted

## 2023-11-07 ENCOUNTER — Encounter: Payer: Self-pay | Admitting: Adult Health

## 2023-11-07 NOTE — Telephone Encounter (Signed)
 Pt fmla form faxed on 11/07/2023 [phone]

## 2023-11-07 NOTE — Telephone Encounter (Signed)
 FMLA completed signed to medical records for processing.

## 2023-11-15 ENCOUNTER — Other Ambulatory Visit (HOSPITAL_COMMUNITY): Payer: Self-pay

## 2023-12-13 ENCOUNTER — Encounter (HOSPITAL_COMMUNITY): Payer: Self-pay | Admitting: Emergency Medicine

## 2023-12-13 ENCOUNTER — Other Ambulatory Visit: Payer: Self-pay

## 2023-12-13 ENCOUNTER — Ambulatory Visit (HOSPITAL_COMMUNITY)
Admission: EM | Admit: 2023-12-13 | Discharge: 2023-12-13 | Disposition: A | Discharge status: Home / Self Care | Attending: Emergency Medicine | Admitting: Emergency Medicine

## 2023-12-13 DIAGNOSIS — J309 Allergic rhinitis, unspecified: Secondary | ICD-10-CM

## 2023-12-13 LAB — POC SARS CORONAVIRUS 2 AG -  ED: SARS Coronavirus 2 Ag: NEGATIVE

## 2023-12-13 MED ORDER — AZELASTINE HCL 0.1 % NA SOLN: 2.0000 | Freq: Two times a day (BID) | NASAL | 0 refills | Status: DC

## 2023-12-13 NOTE — ED Provider Notes (Signed)
 [facility]    CSN: [id number] Arrival date & time: 12/13/23  1807      History   Chief Complaint Chief Complaint  Patient presents with   covid test    HPI Heather Casey is a 33 y.o. female.   Patient presents with scratchy throat and sneezing over the last few days.  Patient states that she recently traveled to [place] and while she was there she began having scratchy throat and sneezing.  Denies cough, congestion, shortness of breath, chest pain, and fever.  Patient states that he took a Zyrtec and Tylenol with relief of symptoms.  Patient is requesting a COVID test.  The history is provided by the patient and medical records.    Past Medical History:  Diagnosis Date   Migraines     There are no active problems to display for this patient.   Past Surgical History:  Procedure Laterality Date   NO PAST SURGERIES      OB History     Gravida  3   Para  1   Term  1   Preterm      AB  2   Living  1      SAB      IAB  2   Ectopic      Multiple      Live Births  1            Home Medications    Prior to Admission medications   Medication Sig Start Date End Date Taking? Authorizing Provider  azelastine (ASTELIN) 0.1 % nasal spray Place 2 sprays into both nostrils 2 (two) times daily. Use in each nostril as directed 12/13/23  Yes Levora Reas A, NP  acetaminophen (TYLENOL) 500 MG tablet Take 1,000 mg by mouth every 8 (eight) hours as needed.    [provider]  Aspirin-Acetaminophen-Caffeine (EXCEDRIN MIGRAINE PO) Take by mouth.    [provider]  Etonogestrel (NEXPLANON [place]) Inject 1 Intra Uterine Device into the skin once.     [provider]  ondansetron (ZOFRAN-ODT) 4 MG disintegrating tablet Take 1-2 tablets (4-8 mg total) by mouth every 8 (eight) hours as needed for nausea. As needed for nausea or can take with Rizatriptan for migraine. 06/20/22   Glory Larsen, MD  rizatriptan (MAXALT-MLT) 10  MG disintegrating tablet Take 1 tablet (10 mg total) by mouth as needed for migraine. May repeat in 2 hours if needed. May take with ondansetron for nausea with the migraine 06/20/22   Glory Larsen, MD  topiramate (TOPAMAX) 50 MG tablet Take 1 tablet by mouth in the morning and 2 tablets by mouth at bedtime. 06/05/23   Clem Currier, NP    Family History Family History  Problem Relation Age of Onset   Hypertension Mother    Migraines Maternal Aunt    Aneurysm Maternal Aunt     Social History Social History   Tobacco Use   Smoking status: Former    Current packs/day: 0.25    Types: Cigarettes   Smokeless tobacco: Never   Tobacco comments:    Stopped January 2024  Vaping Use   Vaping status: Never Used  Substance Use Topics   Alcohol use: Yes    Comment: socially   Drug use: No     Allergies   Bactrim [sulfamethoxazole-trimethoprim] and Penicillins   Review of Systems Review of Systems  Per HPI  Physical Exam Triage Vital Signs ED Triage Vitals  Encounter Vitals  Group     BP 12/13/23 1848 124/76     Systolic BP Percentile --      Diastolic BP Percentile --      Pulse Rate 12/13/23 1848 80     Resp 12/13/23 1848 18     Temp 12/13/23 1848 99 F (37.2 C)     Temp Source 12/13/23 1848 Oral     SpO2 12/13/23 1848 98 %     Weight --      Height --      Head Circumference --      Peak Flow --      Pain Score 12/13/23 1844 0     Pain Loc --      Pain Education --      Exclude from Growth Chart --    No data found.  Updated Vital Signs BP 124/76 (BP Location: Right Arm) Comment (BP Location): large cuff  Pulse 80   Temp 99 F (37.2 C) (Oral)   Resp 18   LMP 11/22/2023 (Approximate)   SpO2 98%   Visual Acuity Right Eye Distance:   Left Eye Distance:   Bilateral Distance:    Right Eye Near:   Left Eye Near:    Bilateral Near:     Physical Exam Vitals and nursing note reviewed.  Constitutional:      General: She is awake. She is not in acute  distress.    Appearance: Normal appearance. She is well-developed and well-groomed. She is not ill-appearing.  HENT:     Right Ear: Tympanic membrane, ear canal and external ear normal.     Left Ear: Tympanic membrane, ear canal and external ear normal.     Nose: Nose normal.     Mouth/Throat:     Mouth: Mucous membranes are moist.     Pharynx: Posterior oropharyngeal erythema present. No oropharyngeal exudate.  Skin:    General: Skin is warm and dry.  Neurological:     Mental Status: She is alert.  Psychiatric:        Behavior: Behavior is cooperative.      UC Treatments / Results  Labs (all labs ordered are listed, but only abnormal results are displayed) Labs Reviewed  POC SARS CORONAVIRUS 2 AG -  ED - Normal    EKG   Radiology No results found.  Procedures Procedures (including critical care time)  Medications Ordered in UC Medications - No data to display  Initial Impression / Assessment and Plan / UC Course  I have reviewed the triage vital signs and the nursing notes.  Pertinent labs & imaging results that were available during my care of the patient were reviewed by me and considered in my medical decision making (see chart for details).     Patient is well-appearing.  Vitals are stable.  Mild erythema noted to pharynx.  No other significant findings upon exam.  Prescribed azelastine nasal spray to assist with allergic rhinitis.  Recommended continuing Zyrtec for symptoms.  Discussed return precautions Final Clinical Impressions(s) / UC Diagnoses   Final diagnoses:  Allergic rhinitis, unspecified seasonality, unspecified trigger     Discharge Instructions      Use azelastine nasal spray twice daily as needed for allergy related symptoms. Continue taking Zyrtec daily to help with your symptoms If Tylenol helps you can continue to take this as needed for your scratchy throat. Return here if symptoms persist or worsen   ED Prescriptions      Medication Sig Dispense Auth. Provider  azelastine (ASTELIN) 0.1 % nasal spray Place 2 sprays into both nostrils 2 (two) times daily. Use in each nostril as directed 30 mL Levora Reas A, NP      PDMP not reviewed this encounter.   Levora Reas A, NP 12/13/23 1949

## 2023-12-13 NOTE — ED Triage Notes (Signed)
 [place] -4/10-4/14 Noticed a slightly itchy throat, sneezed a couple of times while on trip.  Patient took tylenol.  Since being back took a zyrtec.  Seemed to help, but feels like symptoms returning.    One of the other travelers with patient had a runny nose-but did not test.

## 2023-12-13 NOTE — Discharge Instructions (Signed)
 Use azelastine nasal spray twice daily as needed for allergy related symptoms. Continue taking Zyrtec daily to help with your symptoms If Tylenol helps you can continue to take this as needed for your scratchy throat. Return here if symptoms persist or worsen

## 2024-03-04 ENCOUNTER — Telehealth: Payer: Self-pay | Admitting: Adult Health

## 2024-03-04 NOTE — Telephone Encounter (Signed)
 Pt dropped of FMLA for 2025 n/c per Adrien gave to [facility] pod 4

## 2024-03-14 NOTE — Telephone Encounter (Signed)
 FMLA completed and signed.  To medical records.

## 2024-03-15 ENCOUNTER — Telehealth: Payer: Self-pay | Admitting: *Deleted

## 2024-03-15 NOTE — Telephone Encounter (Signed)
 Pt fmla form ready for p/u. Not able to reach pt.

## 2024-04-10 ENCOUNTER — Ambulatory Visit (INDEPENDENT_AMBULATORY_CARE_PROVIDER_SITE_OTHER)

## 2024-04-10 ENCOUNTER — Ambulatory Visit (HOSPITAL_COMMUNITY): Admission: EM | Admit: 2024-04-10 | Discharge: 2024-04-10 | Disposition: A | Discharge status: Home / Self Care

## 2024-04-10 ENCOUNTER — Encounter (HOSPITAL_COMMUNITY): Payer: Self-pay

## 2024-04-10 DIAGNOSIS — M545 Low back pain, unspecified: Secondary | ICD-10-CM

## 2024-04-10 MED ORDER — METHOCARBAMOL 500 MG PO TABS: 500.0000 mg | ORAL_TABLET | Freq: Two times a day (BID) | ORAL | 0 refills | Status: DC

## 2024-04-10 MED ORDER — LIDOCAINE 5 % EX PTCH: 1.0000 | MEDICATED_PATCH | CUTANEOUS | 0 refills | Status: DC

## 2024-04-10 NOTE — ED Provider Notes (Signed)
 [facility]    CSN: [id number] Arrival date & time: 04/10/24  1831      History   Chief Complaint Chief Complaint  Patient presents with   Back Pain    HPI Heather Casey is a 33 y.o. female.   Patient presents today with a 4-day history of lower back pain.  Reports that pain is rated 6 on a 0-10 pain scale, described as sharp, no aggravating or alleviating factors identified.  She denies any known injury, increase in activity, recent trauma.  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  She did take some ibuprofen  yesterday which provided relief of symptoms.  She has had intermittent symptoms since February 2025 and they have become more frequent and more bothersome in the past few months.  She denies any previous workup other than being seen quickly by her primary care.  She has not had any imaging or ever seen a specialist and is interested in additional workup today.  She denies any personal history of malignancy.  She is confident that she is not pregnant.  Denies any bowel/bladder incontinence, lower extremity weakness, saddle anesthesia.    Past Medical History:  Diagnosis Date   Migraines     There are no active problems to display for this patient.   Past Surgical History:  Procedure Laterality Date   NO PAST SURGERIES      OB History     Gravida  3   Para  1   Term  1   Preterm      AB  2   Living  1      SAB      IAB  2   Ectopic      Multiple      Live Births  1            Home Medications    Prior to Admission medications   Medication Sig Start Date End Date Taking? Authorizing Provider  doxycycline  (VIBRAMYCIN ) 100 MG capsule Take 100 mg by mouth daily. 03/27/24  Yes [provider]  lidocaine  (LIDODERM ) 5 % Place 1 patch onto the skin daily. Remove & Discard patch within 12 hours or as directed by MD 04/10/24  Yes  Evany Schecter, Rocky POUR, PA-C  methocarbamol  (ROBAXIN ) 500 MG tablet Take 1 tablet (500 mg total) by mouth 2 (two) times daily. 04/10/24  Yes Lillieann Pavlich K, PA-C  acetaminophen  (TYLENOL ) 500 MG tablet Take 1,000 mg by mouth every 8 (eight) hours as needed.    [provider]  Aspirin-Acetaminophen -Caffeine (EXCEDRIN MIGRAINE PO) Take by mouth.    [provider]  Etonogestrel (NEXPLANON [place]) Inject 1 Intra Uterine Device into the skin once.     [provider]  rizatriptan  (MAXALT -MLT) 10 MG disintegrating tablet Take 1 tablet (10 mg total) by mouth as needed for migraine. May repeat in 2 hours if needed. May take with ondansetron  for nausea with the migraine 06/20/22   Ines Onetha NOVAK, MD  topiramate  (TOPAMAX ) 50 MG tablet Take 1 tablet by mouth in the morning and 2 tablets by mouth at bedtime. 06/05/23   Sherryl Bouchard, NP    Family History Family History  Problem Relation Age of Onset   Hypertension Mother    Migraines Maternal Aunt    Aneurysm Maternal Aunt     Social History Social History   Tobacco Use  Smoking status: Former    Current packs/day: 0.25    Types: Cigarettes   Smokeless tobacco: Never   Tobacco comments:    Stopped January 2024  Vaping Use   Vaping status: Never Used  Substance Use Topics   Alcohol use: Yes    Comment: socially   Drug use: No     Allergies   Bactrim  [sulfamethoxazole -trimethoprim ] and Penicillins   Review of Systems Review of Systems  Constitutional:  Positive for activity change. Negative for appetite change, fatigue and fever.  Gastrointestinal:  Negative for abdominal pain, diarrhea, nausea and vomiting.  Genitourinary:  Negative for difficulty urinating and enuresis.  Musculoskeletal:  Positive for back pain. Negative for arthralgias and myalgias.  Skin:  Negative for rash.  Neurological:  Negative for weakness and numbness.     Physical Exam Triage Vital Signs ED Triage Vitals  Encounter Vitals Group      BP 04/10/24 1912 (!) 147/80     Girls Systolic BP Percentile --      Girls Diastolic BP Percentile --      Boys Systolic BP Percentile --      Boys Diastolic BP Percentile --      Pulse Rate 04/10/24 1912 82     Resp 04/10/24 1912 18     Temp 04/10/24 1912 99.5 F (37.5 C)     Temp Source 04/10/24 1912 Oral     SpO2 04/10/24 1912 98 %     Weight --      Height --      Head Circumference --      Peak Flow --      Pain Score 04/10/24 1913 6     Pain Loc --      Pain Education --      Exclude from Growth Chart --    No data found.  Updated Vital Signs BP (!) 147/80 (BP Location: Right Arm)   Pulse 82   Temp 99.5 F (37.5 C) (Oral)   Resp 18   SpO2 98%   Visual Acuity Right Eye Distance:   Left Eye Distance:   Bilateral Distance:    Right Eye Near:   Left Eye Near:    Bilateral Near:     Physical Exam Vitals reviewed.  Constitutional:      General: She is awake. She is not in acute distress.    Appearance: Normal appearance. She is well-developed. She is not ill-appearing.     Comments: Very pleasant female appears stated age in no acute distress sitting comfortably in exam room  HENT:     Head: Normocephalic and atraumatic.  Cardiovascular:     Rate and Rhythm: Normal rate and regular rhythm.     Heart sounds: Normal heart sounds, S1 normal and S2 normal. No murmur heard. Pulmonary:     Effort: Pulmonary effort is normal.     Breath sounds: Normal breath sounds. No wheezing, rhonchi or rales.     Comments: Clear to auscultation bilaterally Abdominal:     Palpations: Abdomen is soft.     Tenderness: There is no abdominal tenderness. There is no right CVA tenderness, left CVA tenderness, guarding or rebound.  Musculoskeletal:     Cervical back: No tenderness or bony tenderness.     Thoracic back: No tenderness or bony tenderness.     Lumbar back: Tenderness present. No bony tenderness. Positive left straight leg raise test. Negative right straight leg raise  test.     Comments: Back: Tenderness to  palpation of the lumbar paraspinal muscles without spasm.  No pain percussion of vertebrae.  No deformity or step-off noted.  Strength 5/5 bilateral lower extremities.  Positive straight leg raise on left.  Psychiatric:        Behavior: Behavior is cooperative.      UC Treatments / Results  Labs (all labs ordered are listed, but only abnormal results are displayed) Labs Reviewed - No data to display  EKG   Radiology DG Lumbar Spine Complete Result Date: 04/10/2024 CLINICAL DATA:  low back pain EXAM: LUMBAR SPINE - COMPLETE 4+ VIEW COMPARISON:  None Available. FINDINGS: Five non rib-bearing lumbar type vertebral bodies. Normal alignment with expected lumbar lordosis. Vertebral body heights are well maintained without acute fracture. No pars interarticularis defects.Minimal intervertebral disc height loss at L5-S1. Early osteophytosis in the lower lumbar spine. The soft tissues are otherwise unremarkable. IMPRESSION: 1. No acute fracture or malalignment of the lumbar spine. 2. Minimal intervertebral disc height loss at L5-S1. Electronically Signed   By: Rogelia Myers M.D.   On: 04/10/2024 20:06    Procedures Procedures (including critical care time)  Medications Ordered in UC Medications - No data to display  Initial Impression / Assessment and Plan / UC Course  I have reviewed the triage vital signs and the nursing notes.  Pertinent labs & imaging results that were available during my care of the patient were reviewed by me and considered in my medical decision making (see chart for details).     Patient is well-appearing, afebrile, nontoxic, nontachycardic.  X-ray was obtained given her 75-month history of intermittent symptoms that showed degenerative changes without acute osseous abnormality.  We discussed that the best way to investigate back pain is with an MRI that we do not have access to an urgent care and so recommend that she  follow-up with a specialist who can arrange additional evaluation/intervention.  She was given the contact information for this provider and encouraged to call to schedule an appointment.  Patient is very hesitant to take medication and so was encouraged to use over-the-counter analgesics as needed.  A short course of Robaxin  was sent to the pharmacy to be used as needed but we discussed that this can be used as sparingly as she prefers and that it should be avoided if she is going to drive or drink alcohol.  No indication for dose adjustment based on metabolic panel from 06/07/2022 with creatinine of 0.7 and calculated creatinine clearance of 161 mL/min.  She was also given a prescription for lidocaine  patches with instruction to apply this during the day and then remove this at night using only 1 patch per 24 hours.  She can use heat, rest, stretch for symptom relief.  Discussed that she should avoid strenuous activities.  Work excuse note was provided per her request.  Discussed alarm symptoms that warrant emergent evaluation.  Strict return precautions given.  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  I would like you to follow-up with an orthopedic doctor for further evaluation and management.  Call them to schedule an appointment.  You can use Tylenol  and ibuprofen  over-the-counter to help with your pain.  I have called in a few medicines to help with your pain in case your symptoms are  not improving with over-the-counter medicines.  Apply lidocaine  patch during the day and then remove this at night.  Use only 1 patch for 24 hours.  You can take Robaxin  up to twice a day.  This will make you sleepy do not drive or drink alcohol while taking it.  I also recommend heat and gentle stretch.  If anything  worsens and you have worsening pain, numbness or tingling in your legs, going to the bathroom yourself without noticing it, fever, nausea/vomiting you need to be seen immediately.     ED Prescriptions     Medication Sig Dispense Auth. Provider   lidocaine  (LIDODERM ) 5 % Place 1 patch onto the skin daily. Remove & Discard patch within 12 hours or as directed by MD 10 patch Kamani Lewter K, PA-C   methocarbamol  (ROBAXIN ) 500 MG tablet Take 1 tablet (500 mg total) by mouth 2 (two) times daily. 10 tablet Jerriah Ines K, PA-C      PDMP not reviewed this encounter.   Sherrell Rocky POUR, PA-C 04/10/24 2024

## 2024-04-10 NOTE — Discharge Instructions (Signed)
 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  I would like you to follow-up with an orthopedic doctor for further evaluation and management.  Call them to schedule an appointment.  You can use Tylenol  and ibuprofen  over-the-counter to help with your pain.  I have called in a few medicines to help with your pain in case your symptoms are not improving with over-the-counter medicines.  Apply lidocaine  patch during the day and then remove this at night.  Use only 1 patch for 24 hours.  You can take Robaxin  up to twice a day.  This will make you sleepy do not drive or drink alcohol while taking it.  I also recommend heat and gentle stretch.  If anything worsens and you have worsening pain, numbness or tingling in your legs, going to the bathroom yourself without noticing it, fever, nausea/vomiting you need to be seen immediately.

## 2024-04-10 NOTE — ED Triage Notes (Signed)
 Pt c/o mid back pain since Sunday night. Denies injury. States works for [employer] and does heavy lifting. States took ibuprofen  last night with some relief.

## 2024-04-22 ENCOUNTER — Other Ambulatory Visit (HOSPITAL_COMMUNITY): Payer: Self-pay

## 2024-04-22 MED ORDER — WEGOVY 0.25 MG/0.5ML ~~LOC~~ SOAJ
0.2500 mg | SUBCUTANEOUS | 0 refills | Status: DC
  Filled 2024-04-22 – 2024-05-22 (×2): qty 2, 28d supply, fill #0

## 2024-04-22 MED ORDER — WEGOVY 0.5 MG/0.5ML ~~LOC~~ SOAJ: 0.5000 mg | SUBCUTANEOUS | 0 refills | Status: DC

## 2024-05-01 ENCOUNTER — Other Ambulatory Visit (HOSPITAL_COMMUNITY): Payer: Self-pay

## 2024-05-22 ENCOUNTER — Other Ambulatory Visit: Payer: Self-pay

## 2024-06-09 NOTE — Progress Notes (Unsigned)
 PATIENT: Heather Casey DOB: Mar 05, 1991  REASON FOR VISIT: follow up HISTORY FROM: patient PRIMARY NEUROLOGIST: Dr. Ines   No chief complaint on file.    HISTORY OF PRESENT ILLNESS: Today 06/09/24  Heather Casey is a 33 y.o. female who has been followed in this office for Migraines. Returns today for follow-up.  She reports that overall her migraines are relatively stable.  She currently is getting about 2 migraines a month.  Continues on Topamax  50 mg in the morning and 100 mg in the evening.  She has Maxalt  but typically uses Excedrin.  Currently this is working well for her headaches   HISTORY 11/11/22:   Heather Casey is a 33 y.o. female with a history of migraines. Returns today for follow-up.  Tried Nortriptyline  but she didn't feel good on this medication- her weight increased and mood changes. Topamax  does causes paresthesias in the hands but its manageable. Typically will get an increase in migraines around her migraines. 3 migraines in the last 3 weeks. Maxalt  works well.  Right now she feels that Topamax  is working well for her.  She currently has Nexplanon for birth control.  Did advise that Topamax  may interfere with her birth control therefore she should use a backup method.  She advised that she was currently not sexually active.  Also advised that Topamax  is not compatible with pregnancy.  Advised that if she considers pregnancy in the future this medication will need to be discontinued.   09/19/22: Heather Casey is a 33 y.o. female with a history of Chronic Migraine. Returns today for /follow-up. Currently taking Topamax  50 mg at bedtime. Initially made her sleepy but now tolerating it better. Not sure that her headaches are better. Having about 1 headache a week it c/an last 2-3 days.  It could skip a week. Headaches are tend to come when she is on her menstrual cycle. Has tried maxalt  but was not always taking it at the onset of migraine.   REVIEW OF SYSTEMS: Out of a  complete 14 system review of symptoms, the patient complains only of the following symptoms, and all other reviewed systems are negative.  ALLERGIES: Allergies  Allergen Reactions   Bactrim  [Sulfamethoxazole -Trimethoprim ] Itching and Rash   Penicillins Rash and Other (See Comments)        HOME MEDICATIONS: Outpatient Medications Prior to Visit  Medication Sig Dispense Refill   acetaminophen  (TYLENOL ) 500 MG tablet Take 1,000 mg by mouth every 8 (eight) hours as needed.     Aspirin-Acetaminophen -Caffeine (EXCEDRIN MIGRAINE PO) Take by mouth.     doxycycline  (VIBRAMYCIN ) 100 MG capsule Take 100 mg by mouth daily.     Etonogestrel (NEXPLANON Inglis) Inject 1 Intra Uterine Device into the skin once.      lidocaine  (LIDODERM ) 5 % Place 1 patch onto the skin daily. Remove & Discard patch within 12 hours or as directed by MD 10 patch 0   methocarbamol  (ROBAXIN ) 500 MG tablet Take 1 tablet (500 mg total) by mouth 2 (two) times daily. 10 tablet 0   rizatriptan  (MAXALT -MLT) 10 MG disintegrating tablet Take 1 tablet (10 mg total) by mouth as needed for migraine. May repeat in 2 hours if needed. May take with ondansetron  for nausea with the migraine 9 tablet 11   semaglutide -weight management (WEGOVY ) 0.25 MG/0.5ML SOAJ SQ injection Inject 0.25 mg into the skin once a week. 2 mL 0   semaglutide -weight management (WEGOVY ) 0.5 MG/0.5ML SOAJ SQ injection Inject 0.5 mg  into the skin once a week. 2 mL 0   topiramate  (TOPAMAX ) 50 MG tablet Take 1 tablet by mouth in the morning and 2 tablets by mouth at bedtime. 270 tablet 3   No facility-administered medications prior to visit.    PAST MEDICAL HISTORY: Past Medical History:  Diagnosis Date   Migraines     PAST SURGICAL HISTORY: Past Surgical History:  Procedure Laterality Date   NO PAST SURGERIES      FAMILY HISTORY: Family History  Problem Relation Age of Onset   Hypertension Mother    Migraines Maternal Aunt    Aneurysm Maternal Aunt      SOCIAL HISTORY: Social History   Socioeconomic History   Marital status: Single    Spouse name: Not on file   Number of children: Not on file   Years of education: Not on file   Highest education level: Not on file  Occupational History   Not on file  Tobacco Use   Smoking status: Former    Current packs/day: 0.25    Types: Cigarettes   Smokeless tobacco: Never   Tobacco comments:    Stopped January 2024  Vaping Use   Vaping status: Never Used  Substance and Sexual Activity   Alcohol use: Yes    Comment: socially   Drug use: No   Sexual activity: Not on file  Other Topics Concern   Not on file  Social History Narrative   Caffeine: none    Education: college   Work at [employer].    Social Drivers of Corporate investment banker Strain: Low Risk  (01/14/2024)   Received from [facility]   Overall Financial Resource Strain (CARDIA)    Difficulty of Paying Living Expenses: Not very hard  Food Insecurity: No Food Insecurity (01/14/2024)   Received from [facility]   Hunger Vital Sign    Within the past 12 months, you worried that your food would run out before you got the money to buy more.: Never true    Within the past 12 months, the food you bought just didn't last and you didn't have money to get more.: Never true  Transportation Needs: No Transportation Needs (01/14/2024)   Received from [facility] - Transportation    Lack of Transportation (Medical): No    Lack of Transportation (Non-Medical): No  Physical Activity: Sufficiently Active (01/14/2024)   Received from [facility]   Exercise Vital Sign    On average, how many days per week do you engage in moderate to strenuous exercise (like a brisk walk)?: 7 days    On average, how many minutes do you engage in exercise at this level?: 150+ min  Stress: No Stress Concern Present (01/14/2024)   Received from [facility] - Occupational Stress  Questionnaire    Feeling of Stress : Only a little  Social Connections: Somewhat Isolated (01/14/2024)   Received from [facility]   Social Network    How would you rate your social network (family, work, friends)?: Restricted participation with some degree of social isolation  Intimate Partner Violence: Not At Risk (01/14/2024)   Received from [facility]   HITS    Over the last 12 months how often did your partner physically hurt you?: Never    Over the last 12 months how often did your partner insult you or talk down to you?: Never    Over the last 12 months  how often did your partner threaten you with physical harm?: Never    Over the last 12 months how often did your partner scream or curse at you?: Never      PHYSICAL EXAM  There were no vitals filed for this visit.  There is no height or weight on file to calculate BMI.  Generalized: Well developed, in no acute distress   Neurological examination  Mentation: Alert oriented to time, place, history taking. Follows all commands speech and language fluent Cranial nerve II-XII: Pupils were equal round reactive to light. Extraocular movements were full, visual field were full on confrontational test. Facial sensation and strength were normal.  Head turning and shoulder shrug  were normal and symmetric. Motor: The motor testing reveals 5 over 5 strength of all 4 extremities. Good symmetric motor tone is noted throughout.  Sensory: Sensory testing is intact to soft touch on all 4 extremities. No evidence of extinction is noted.  Coordination: Cerebellar testing reveals good finger-nose-finger and heel-to-shin bilaterally.  Gait and station: Gait is normal. Reflexes: Deep tendon reflexes are symmetric and normal bilaterally.   DIAGNOSTIC DATA (LABS, IMAGING, TESTING) - I reviewed patient records, labs, notes, testing and imaging myself where available.  Lab Results  Component Value Date   WBC 6.8 06/07/2022   HGB 13.5  06/07/2022   HCT 40.0 06/07/2022   MCV 89.3 06/07/2022   PLT 435 (H) 06/07/2022      Component Value Date/Time   NA 136 06/07/2022 1817   K 4.2 06/07/2022 1817   CL 102 06/07/2022 1817   CO2 28 06/07/2022 1817   GLUCOSE 85 06/07/2022 1817   BUN 12 06/07/2022 1817   CREATININE 0.70 06/07/2022 1817   CALCIUM 9.5 06/07/2022 1817   GFRNONAA >60 06/07/2022 1817   GFRAA >60 02/12/2017 2324   Lab Results  Component Value Date   TSH 0.905 06/20/2022      ASSESSMENT AND PLAN 33 y.o. year old female  has a past medical history of Migraines. here with:  Migraine   Continue Topamax  50 mg in the morning 100 mg at bedtime Continue Maxalt  for abortive therapy. Take at the onset of migraine. Can repeat in 2 hours if needed. Cautioned about medication overuse Again.,reviewed the interaction with Topamax  and her birth control.  Advised patient that she should use a backup method if she is sexually active.  Also advised that this medication is not compatible with pregnancy. FU in 6-7 months or sooner if needed     Duwaine Russell, MSN, NP-C 06/09/2024, 8:48 PM [facility] [address] [place], [place] [address] [phone]

## 2024-06-10 ENCOUNTER — Encounter: Payer: Self-pay | Admitting: Adult Health

## 2024-06-10 ENCOUNTER — Ambulatory Visit: Payer: Federal, State, Local not specified - PPO | Admitting: Adult Health

## 2024-06-10 VITALS — BP 112/71 | HR 67 | Ht 64.0 in | Wt 224.0 lb

## 2024-06-10 DIAGNOSIS — G43709 Chronic migraine without aura, not intractable, without status migrainosus: Secondary | ICD-10-CM | POA: Diagnosis not present

## 2024-06-10 NOTE — Patient Instructions (Signed)
 Your Plan:  Continue Topamax  50 mg in the morning and 100 mg at bedtime If your symptoms worsen or you develop new symptoms please let us  know.    Thank you for coming to see us  at [facility]. I hope we have been able to provide you high quality care today.  You may receive a patient satisfaction survey over the next few weeks. We would appreciate your feedback and comments so that we may continue to improve ourselves and the health of our patients.

## 2025-06-23 ENCOUNTER — Ambulatory Visit: Admitting: Adult Health
